# Patient Record
Sex: Male | Born: 1975 | Race: White | Hispanic: No | Marital: Married | Smoking: Never smoker
Health system: Southern US, Community
[De-identification: ages and names within clinical notes are randomized; demographics above are authoritative.]

## PROBLEM LIST (undated history)

## (undated) DIAGNOSIS — I10 Essential (primary) hypertension: Secondary | ICD-10-CM

## (undated) DIAGNOSIS — C801 Malignant (primary) neoplasm, unspecified: Secondary | ICD-10-CM

## (undated) HISTORY — PX: JOINT REPLACEMENT: SHX530

## (undated) HISTORY — PX: FEMUR SURGERY: SHX943

---

## 2016-11-16 DIAGNOSIS — E785 Hyperlipidemia, unspecified: Secondary | ICD-10-CM | POA: Diagnosis not present

## 2016-11-16 DIAGNOSIS — E349 Endocrine disorder, unspecified: Secondary | ICD-10-CM | POA: Diagnosis not present

## 2016-11-16 DIAGNOSIS — Z79899 Other long term (current) drug therapy: Secondary | ICD-10-CM | POA: Diagnosis not present

## 2016-11-16 DIAGNOSIS — E559 Vitamin D deficiency, unspecified: Secondary | ICD-10-CM | POA: Diagnosis not present

## 2016-11-16 DIAGNOSIS — R739 Hyperglycemia, unspecified: Secondary | ICD-10-CM | POA: Diagnosis not present

## 2016-12-01 ENCOUNTER — Emergency Department (HOSPITAL_BASED_OUTPATIENT_CLINIC_OR_DEPARTMENT_OTHER): Payer: Worker's Compensation

## 2016-12-01 ENCOUNTER — Emergency Department (HOSPITAL_BASED_OUTPATIENT_CLINIC_OR_DEPARTMENT_OTHER)
Admission: EM | Admit: 2016-12-01 | Discharge: 2016-12-01 | Disposition: A | Discharge status: Home / Self Care | Payer: Worker's Compensation | Attending: Emergency Medicine | Admitting: Emergency Medicine

## 2016-12-01 ENCOUNTER — Encounter (HOSPITAL_BASED_OUTPATIENT_CLINIC_OR_DEPARTMENT_OTHER): Payer: Self-pay | Admitting: Emergency Medicine

## 2016-12-01 DIAGNOSIS — S3992XA Unspecified injury of lower back, initial encounter: Secondary | ICD-10-CM | POA: Diagnosis present

## 2016-12-01 DIAGNOSIS — Z79899 Other long term (current) drug therapy: Secondary | ICD-10-CM | POA: Diagnosis not present

## 2016-12-01 DIAGNOSIS — Y929 Unspecified place or not applicable: Secondary | ICD-10-CM | POA: Insufficient documentation

## 2016-12-01 DIAGNOSIS — Z85830 Personal history of malignant neoplasm of bone: Secondary | ICD-10-CM | POA: Diagnosis not present

## 2016-12-01 DIAGNOSIS — S39012A Strain of muscle, fascia and tendon of lower back, initial encounter: Secondary | ICD-10-CM

## 2016-12-01 DIAGNOSIS — Y990 Civilian activity done for income or pay: Secondary | ICD-10-CM | POA: Insufficient documentation

## 2016-12-01 DIAGNOSIS — Y9389 Activity, other specified: Secondary | ICD-10-CM | POA: Insufficient documentation

## 2016-12-01 DIAGNOSIS — X500XXA Overexertion from strenuous movement or load, initial encounter: Secondary | ICD-10-CM | POA: Insufficient documentation

## 2016-12-01 DIAGNOSIS — I10 Essential (primary) hypertension: Secondary | ICD-10-CM | POA: Diagnosis not present

## 2016-12-01 HISTORY — DX: Essential (primary) hypertension: I10

## 2016-12-01 HISTORY — DX: Malignant (primary) neoplasm, unspecified: C80.1

## 2016-12-01 MED ORDER — CYCLOBENZAPRINE HCL 10 MG PO TABS: 10.0000 mg | ORAL_TABLET | Freq: Two times a day (BID) | ORAL | 0 refills | Status: AC | PRN

## 2016-12-01 NOTE — ED Provider Notes (Signed)
[place] [facility] Provider Note   CSN: [id number] Arrival date & time: 12/01/16  1632  By signing my name below, I, Kenneth Potts, attest that this documentation has been prepared under the direction and in the presence of [employer], PA-C. Electronically Signed: Gwenlyn Potts, ED Scribe. 12/01/16. 5:35 PM.  History   Chief Complaint Chief Complaint  Patient presents with  . Back Pain   The history is provided by the patient. No language interpreter was used.    HPI Comments: Kenneth Potts is a 41 y.o. male with PMHx of bone cancer and HTN who presents to the Emergency Department complaining of acute onset, constant, 9/10 lower back pain s/p injury at 2 PM. Back pain has gradually improved but is exacerbated when standing up and deep inhaltion. Pt bent over to pickup bread from the bottom shelf and felt a popping sensation in his lower back. He has tried Vicodin with mild relief to pain. No hx of back problems. No recent heavy lifting, injury, or trauma. Pt denies numbness, tingling, weakness, urinary or bowel incontinence.  Past Medical History:  Diagnosis Date  . Cancer ([place])   . Hypertension    There are no active problems to display for this patient.  Past Surgical History:  Procedure Laterality Date  . FEMUR SURGERY Right   . JOINT REPLACEMENT      Home Medications    Prior to Admission medications   Medication Sig Start Date End Date Taking? Authorizing Provider  gabapentin (NEURONTIN) 100 MG capsule Take 100 mg by mouth 3 (three) times daily.   Yes Historical Provider, MD  HYDROcodone-acetaminophen (NORCO/VICODIN) 5-325 MG tablet Take 1 tablet by mouth every 6 (six) hours as needed for moderate pain.   Yes Historical Provider, MD  losartan-hydrochlorothiazide (HYZAAR) 100-12.5 MG tablet Take 1 tablet by mouth daily.   Yes Historical Provider, MD  meloxicam (MOBIC) 15 MG tablet Take 15 mg by mouth daily.   Yes Historical Provider, MD  cyclobenzaprine (FLEXERIL) 10  MG tablet Take 1 tablet (10 mg total) by mouth 2 (two) times daily as needed for muscle spasms. 12/01/16   [name], PA-C    Family History No family history on file.  Social History Social History  Substance Use Topics  . Smoking status: Never Smoker  . Smokeless tobacco: Never Used  . Alcohol use No    Allergies   Penicillins  Review of Systems Review of Systems  Genitourinary:       No urinary or bowel incontinence   Musculoskeletal: Positive for back pain.  Neurological: Negative for weakness and numbness.  All other systems reviewed and are negative.  Physical Exam Updated Vital Signs BP 125/100   Pulse 85   Temp 98.3 F (36.8 C) (Oral)   Resp 18   Ht 5\' 11"  (1.803 m)   Wt 122.5 kg   SpO2 99%   BMI 37.66 kg/m   Physical Exam  Constitutional: He is oriented to person, place, and time. He appears well-developed and well-nourished. He is active. No distress.  HENT:  Head: Normocephalic and atraumatic.  Eyes: Conjunctivae are normal.  Cardiovascular: Normal rate.   Pulmonary/Chest: Effort normal. No respiratory distress.  Musculoskeletal: Normal range of motion.  No CT or L-spine tenderness-back atraumatic with no signs of trauma-straight leg negative distal sensation strength and motor function intact  Neurological: He is alert and oriented to person, place, and time.  Skin: Skin is warm and dry.  Psychiatric: He has a normal mood and  affect. His behavior is normal.  Nursing note and vitals reviewed.  ED Treatments / Results  DIAGNOSTIC STUDIES: Oxygen Saturation is 98% on RA, normal by my interpretation.    COORDINATION OF CARE: 5:20 PM Discussed treatment plan with pt at bedside which includes DG Lumbar Complete, muscle relaxer, and referral to sports medicine and pt agreed to plan.  Labs (all labs ordered are listed, but only abnormal results are displayed) Labs Reviewed - No data to display  EKG  EKG Interpretation None        Radiology Dg Lumbar Spine Complete  Result Date: 12/01/2016 CLINICAL DATA:  Low back injury at work today.  Lifting injury EXAM: LUMBAR SPINE - COMPLETE 4+ VIEW COMPARISON:  None. FINDINGS: No fracture. No subluxation. Intervertebral disc spaces are preserved and lumbar spine. Patient is noted to have loss of disc height with endplate degeneration at T11-12. SI joints are normal. IMPRESSION: Degenerative changes T11-12.  Otherwise unremarkable. Electronically Signed   By: Misty Stanley M.D.   On: 12/01/2016 17:20    Procedures Procedures (including critical care time)  Medications Ordered in ED Medications - No data to display   Initial Impression / Assessment and Plan / ED Course  I have reviewed the triage vital signs and the nursing notes.  Pertinent labs & imaging results that were available during my care of the patient were reviewed by me and considered in my medical decision making (see chart for details).     I personally performed the services described in this documentation, which was scribed in my presence. The recorded information has been reviewed and is accurate.   Final Clinical Impressions(s) / ED Diagnoses   Final diagnoses:  Strain of lumbar region, initial encounter    41 year old male presents today with back pain.  This is uncomplicated with no red flags.  He will be given care instructions, follow-up information and strict return precautions.  He verbalized understanding and agreement to today's plan had no further questions or concerns  New Prescriptions Discharge Medication List as of 12/01/2016  5:55 PM    START taking these medications   Details  cyclobenzaprine (FLEXERIL) 10 MG tablet Take 1 tablet (10 mg total) by mouth 2 (two) times daily as needed for muscle spasms., Starting Sat 12/01/2016, Print         [employer], PA-C 12/02/16 [place], MD 12/02/16 1435

## 2016-12-01 NOTE — Discharge Instructions (Signed)
Please read attached information. If you experience any new or worsening signs or symptoms please return to the emergency room for evaluation. Please follow-up with your primary care provider or specialist as discussed. Please use medication prescribed only as directed and discontinue taking if you have any concerning signs or symptoms.   °

## 2016-12-01 NOTE — ED Notes (Signed)
Given ice pack in triage

## 2016-12-01 NOTE — ED Triage Notes (Signed)
Bent over at work to pick up bread off bottom shelf and felt back pop and numbness/tingling to right leg. Accident about 1400 today. Took Vicodin PTA

## 2016-12-14 ENCOUNTER — Other Ambulatory Visit: Payer: Self-pay | Admitting: Sports Medicine

## 2016-12-14 DIAGNOSIS — S335XXA Sprain of ligaments of lumbar spine, initial encounter: Secondary | ICD-10-CM | POA: Diagnosis not present

## 2016-12-14 DIAGNOSIS — M545 Low back pain: Secondary | ICD-10-CM

## 2016-12-19 ENCOUNTER — Ambulatory Visit
Admission: RE | Admit: 2016-12-19 | Discharge: 2016-12-19 | Disposition: A | Discharge status: Home / Self Care | Payer: BLUE CROSS/BLUE SHIELD | Source: Ambulatory Visit | Attending: Sports Medicine | Admitting: Sports Medicine

## 2016-12-19 DIAGNOSIS — M545 Low back pain: Secondary | ICD-10-CM

## 2016-12-19 DIAGNOSIS — M48061 Spinal stenosis, lumbar region without neurogenic claudication: Secondary | ICD-10-CM | POA: Diagnosis not present

## 2016-12-20 DIAGNOSIS — M545 Low back pain: Secondary | ICD-10-CM | POA: Diagnosis not present

## 2016-12-28 ENCOUNTER — Other Ambulatory Visit: Payer: Self-pay

## 2017-02-20 DIAGNOSIS — E559 Vitamin D deficiency, unspecified: Secondary | ICD-10-CM | POA: Diagnosis not present

## 2017-02-20 DIAGNOSIS — I10 Essential (primary) hypertension: Secondary | ICD-10-CM | POA: Diagnosis not present

## 2017-02-20 DIAGNOSIS — Z79899 Other long term (current) drug therapy: Secondary | ICD-10-CM | POA: Diagnosis not present

## 2017-02-20 DIAGNOSIS — E785 Hyperlipidemia, unspecified: Secondary | ICD-10-CM | POA: Diagnosis not present

## 2017-02-20 DIAGNOSIS — R739 Hyperglycemia, unspecified: Secondary | ICD-10-CM | POA: Diagnosis not present

## 2017-02-21 DIAGNOSIS — T8484XD Pain due to internal orthopedic prosthetic devices, implants and grafts, subsequent encounter: Secondary | ICD-10-CM | POA: Diagnosis not present

## 2017-02-21 DIAGNOSIS — Z96651 Presence of right artificial knee joint: Secondary | ICD-10-CM | POA: Diagnosis not present

## 2017-02-21 DIAGNOSIS — R936 Abnormal findings on diagnostic imaging of limbs: Secondary | ICD-10-CM | POA: Diagnosis not present

## 2017-03-01 DIAGNOSIS — Z96651 Presence of right artificial knee joint: Secondary | ICD-10-CM | POA: Diagnosis not present

## 2017-03-01 DIAGNOSIS — T8484XD Pain due to internal orthopedic prosthetic devices, implants and grafts, subsequent encounter: Secondary | ICD-10-CM | POA: Diagnosis not present

## 2017-03-20 DIAGNOSIS — Z96651 Presence of right artificial knee joint: Secondary | ICD-10-CM | POA: Diagnosis not present

## 2017-03-20 DIAGNOSIS — T8484XD Pain due to internal orthopedic prosthetic devices, implants and grafts, subsequent encounter: Secondary | ICD-10-CM | POA: Diagnosis not present

## 2017-03-20 DIAGNOSIS — F418 Other specified anxiety disorders: Secondary | ICD-10-CM | POA: Diagnosis not present

## 2017-03-26 DIAGNOSIS — M25561 Pain in right knee: Secondary | ICD-10-CM | POA: Diagnosis not present

## 2017-03-26 DIAGNOSIS — M25461 Effusion, right knee: Secondary | ICD-10-CM | POA: Diagnosis not present

## 2017-03-26 DIAGNOSIS — T8484XD Pain due to internal orthopedic prosthetic devices, implants and grafts, subsequent encounter: Secondary | ICD-10-CM | POA: Diagnosis not present

## 2017-03-26 DIAGNOSIS — Z96651 Presence of right artificial knee joint: Secondary | ICD-10-CM | POA: Diagnosis not present

## 2017-05-01 DIAGNOSIS — Z96651 Presence of right artificial knee joint: Secondary | ICD-10-CM | POA: Diagnosis not present

## 2017-05-01 DIAGNOSIS — T8484XD Pain due to internal orthopedic prosthetic devices, implants and grafts, subsequent encounter: Secondary | ICD-10-CM | POA: Diagnosis not present

## 2017-05-03 DIAGNOSIS — T8484XA Pain due to internal orthopedic prosthetic devices, implants and grafts, initial encounter: Secondary | ICD-10-CM | POA: Diagnosis not present

## 2017-05-03 DIAGNOSIS — Z01812 Encounter for preprocedural laboratory examination: Secondary | ICD-10-CM | POA: Diagnosis not present

## 2017-05-03 DIAGNOSIS — Z96651 Presence of right artificial knee joint: Secondary | ICD-10-CM | POA: Diagnosis not present

## 2017-05-03 DIAGNOSIS — Z01810 Encounter for preprocedural cardiovascular examination: Secondary | ICD-10-CM | POA: Diagnosis not present

## 2017-05-03 DIAGNOSIS — Z01818 Encounter for other preprocedural examination: Secondary | ICD-10-CM | POA: Diagnosis not present

## 2017-05-03 DIAGNOSIS — Z85830 Personal history of malignant neoplasm of bone: Secondary | ICD-10-CM | POA: Diagnosis not present

## 2017-05-03 DIAGNOSIS — M1711 Unilateral primary osteoarthritis, right knee: Secondary | ICD-10-CM | POA: Diagnosis not present

## 2017-05-03 DIAGNOSIS — I10 Essential (primary) hypertension: Secondary | ICD-10-CM | POA: Diagnosis not present

## 2017-05-03 DIAGNOSIS — Z79899 Other long term (current) drug therapy: Secondary | ICD-10-CM | POA: Diagnosis not present

## 2017-05-08 DIAGNOSIS — Z96651 Presence of right artificial knee joint: Secondary | ICD-10-CM | POA: Diagnosis not present

## 2017-05-08 DIAGNOSIS — T8484XA Pain due to internal orthopedic prosthetic devices, implants and grafts, initial encounter: Secondary | ICD-10-CM | POA: Diagnosis not present

## 2017-05-08 DIAGNOSIS — R2689 Other abnormalities of gait and mobility: Secondary | ICD-10-CM | POA: Diagnosis not present

## 2017-05-08 DIAGNOSIS — Z471 Aftercare following joint replacement surgery: Secondary | ICD-10-CM | POA: Diagnosis not present

## 2017-05-08 DIAGNOSIS — M19012 Primary osteoarthritis, left shoulder: Secondary | ICD-10-CM | POA: Diagnosis not present

## 2017-05-08 DIAGNOSIS — M1712 Unilateral primary osteoarthritis, left knee: Secondary | ICD-10-CM | POA: Diagnosis not present

## 2017-05-08 DIAGNOSIS — Z79891 Long term (current) use of opiate analgesic: Secondary | ICD-10-CM | POA: Diagnosis not present

## 2017-05-08 DIAGNOSIS — I10 Essential (primary) hypertension: Secondary | ICD-10-CM | POA: Diagnosis not present

## 2017-05-08 DIAGNOSIS — D62 Acute posthemorrhagic anemia: Secondary | ICD-10-CM | POA: Diagnosis not present

## 2017-05-08 DIAGNOSIS — Z6836 Body mass index (BMI) 36.0-36.9, adult: Secondary | ICD-10-CM | POA: Diagnosis not present

## 2017-05-08 DIAGNOSIS — T84032A Mechanical loosening of internal right knee prosthetic joint, initial encounter: Secondary | ICD-10-CM | POA: Diagnosis not present

## 2017-05-08 DIAGNOSIS — M25511 Pain in right shoulder: Secondary | ICD-10-CM | POA: Diagnosis not present

## 2017-05-08 DIAGNOSIS — Z85830 Personal history of malignant neoplasm of bone: Secondary | ICD-10-CM | POA: Diagnosis not present

## 2017-05-08 DIAGNOSIS — M25512 Pain in left shoulder: Secondary | ICD-10-CM | POA: Diagnosis not present

## 2017-05-08 DIAGNOSIS — Z880 Allergy status to penicillin: Secondary | ICD-10-CM | POA: Diagnosis not present

## 2017-05-08 DIAGNOSIS — T84012A Broken internal right knee prosthesis, initial encounter: Secondary | ICD-10-CM | POA: Diagnosis not present

## 2017-05-08 DIAGNOSIS — M19011 Primary osteoarthritis, right shoulder: Secondary | ICD-10-CM | POA: Diagnosis not present

## 2017-05-08 DIAGNOSIS — Z7409 Other reduced mobility: Secondary | ICD-10-CM | POA: Diagnosis not present

## 2017-05-08 DIAGNOSIS — Z791 Long term (current) use of non-steroidal anti-inflammatories (NSAID): Secondary | ICD-10-CM | POA: Diagnosis not present

## 2017-05-08 DIAGNOSIS — M65161 Other infective (teno)synovitis, right knee: Secondary | ICD-10-CM | POA: Diagnosis not present

## 2017-05-08 DIAGNOSIS — Z79899 Other long term (current) drug therapy: Secondary | ICD-10-CM | POA: Diagnosis not present

## 2017-05-08 DIAGNOSIS — T84062A Wear of articular bearing surface of internal prosthetic right knee joint, initial encounter: Secondary | ICD-10-CM | POA: Diagnosis not present

## 2017-05-08 DIAGNOSIS — E669 Obesity, unspecified: Secondary | ICD-10-CM | POA: Diagnosis not present

## 2017-05-08 DIAGNOSIS — T8484XD Pain due to internal orthopedic prosthetic devices, implants and grafts, subsequent encounter: Secondary | ICD-10-CM | POA: Diagnosis not present

## 2017-05-10 DIAGNOSIS — M25511 Pain in right shoulder: Secondary | ICD-10-CM | POA: Diagnosis not present

## 2017-05-10 DIAGNOSIS — M19011 Primary osteoarthritis, right shoulder: Secondary | ICD-10-CM | POA: Diagnosis not present

## 2017-05-10 DIAGNOSIS — M25512 Pain in left shoulder: Secondary | ICD-10-CM | POA: Diagnosis not present

## 2017-05-10 DIAGNOSIS — M19012 Primary osteoarthritis, left shoulder: Secondary | ICD-10-CM | POA: Diagnosis not present

## 2017-05-12 DIAGNOSIS — T8484XD Pain due to internal orthopedic prosthetic devices, implants and grafts, subsequent encounter: Secondary | ICD-10-CM | POA: Diagnosis not present

## 2017-05-12 DIAGNOSIS — Z7409 Other reduced mobility: Secondary | ICD-10-CM | POA: Diagnosis not present

## 2017-05-12 DIAGNOSIS — R2689 Other abnormalities of gait and mobility: Secondary | ICD-10-CM | POA: Diagnosis not present

## 2017-05-14 DIAGNOSIS — I10 Essential (primary) hypertension: Secondary | ICD-10-CM | POA: Diagnosis not present

## 2017-05-14 DIAGNOSIS — T8484XD Pain due to internal orthopedic prosthetic devices, implants and grafts, subsequent encounter: Secondary | ICD-10-CM | POA: Diagnosis not present

## 2017-05-14 DIAGNOSIS — Z85830 Personal history of malignant neoplasm of bone: Secondary | ICD-10-CM | POA: Diagnosis not present

## 2017-05-14 DIAGNOSIS — Z791 Long term (current) use of non-steroidal anti-inflammatories (NSAID): Secondary | ICD-10-CM | POA: Diagnosis not present

## 2017-05-14 DIAGNOSIS — T84012D Broken internal right knee prosthesis, subsequent encounter: Secondary | ICD-10-CM | POA: Diagnosis not present

## 2017-05-17 DIAGNOSIS — Z791 Long term (current) use of non-steroidal anti-inflammatories (NSAID): Secondary | ICD-10-CM | POA: Diagnosis not present

## 2017-05-17 DIAGNOSIS — T84012D Broken internal right knee prosthesis, subsequent encounter: Secondary | ICD-10-CM | POA: Diagnosis not present

## 2017-05-17 DIAGNOSIS — Z85830 Personal history of malignant neoplasm of bone: Secondary | ICD-10-CM | POA: Diagnosis not present

## 2017-05-17 DIAGNOSIS — I10 Essential (primary) hypertension: Secondary | ICD-10-CM | POA: Diagnosis not present

## 2017-05-17 DIAGNOSIS — T8484XD Pain due to internal orthopedic prosthetic devices, implants and grafts, subsequent encounter: Secondary | ICD-10-CM | POA: Diagnosis not present

## 2017-05-20 DIAGNOSIS — Z85830 Personal history of malignant neoplasm of bone: Secondary | ICD-10-CM | POA: Diagnosis not present

## 2017-05-20 DIAGNOSIS — T84012D Broken internal right knee prosthesis, subsequent encounter: Secondary | ICD-10-CM | POA: Diagnosis not present

## 2017-05-20 DIAGNOSIS — T8484XD Pain due to internal orthopedic prosthetic devices, implants and grafts, subsequent encounter: Secondary | ICD-10-CM | POA: Diagnosis not present

## 2017-05-20 DIAGNOSIS — Z791 Long term (current) use of non-steroidal anti-inflammatories (NSAID): Secondary | ICD-10-CM | POA: Diagnosis not present

## 2017-05-20 DIAGNOSIS — I10 Essential (primary) hypertension: Secondary | ICD-10-CM | POA: Diagnosis not present

## 2017-05-21 DIAGNOSIS — T8484XD Pain due to internal orthopedic prosthetic devices, implants and grafts, subsequent encounter: Secondary | ICD-10-CM | POA: Diagnosis not present

## 2017-05-21 DIAGNOSIS — Z85830 Personal history of malignant neoplasm of bone: Secondary | ICD-10-CM | POA: Diagnosis not present

## 2017-05-21 DIAGNOSIS — T84012D Broken internal right knee prosthesis, subsequent encounter: Secondary | ICD-10-CM | POA: Diagnosis not present

## 2017-05-21 DIAGNOSIS — Z791 Long term (current) use of non-steroidal anti-inflammatories (NSAID): Secondary | ICD-10-CM | POA: Diagnosis not present

## 2017-05-21 DIAGNOSIS — I10 Essential (primary) hypertension: Secondary | ICD-10-CM | POA: Diagnosis not present

## 2017-05-21 DIAGNOSIS — Z96651 Presence of right artificial knee joint: Secondary | ICD-10-CM | POA: Diagnosis not present

## 2017-05-21 DIAGNOSIS — Z0389 Encounter for observation for other suspected diseases and conditions ruled out: Secondary | ICD-10-CM | POA: Diagnosis not present

## 2017-05-23 DIAGNOSIS — Z85830 Personal history of malignant neoplasm of bone: Secondary | ICD-10-CM | POA: Diagnosis not present

## 2017-05-23 DIAGNOSIS — I10 Essential (primary) hypertension: Secondary | ICD-10-CM | POA: Diagnosis not present

## 2017-05-23 DIAGNOSIS — Z791 Long term (current) use of non-steroidal anti-inflammatories (NSAID): Secondary | ICD-10-CM | POA: Diagnosis not present

## 2017-05-23 DIAGNOSIS — T8484XD Pain due to internal orthopedic prosthetic devices, implants and grafts, subsequent encounter: Secondary | ICD-10-CM | POA: Diagnosis not present

## 2017-05-23 DIAGNOSIS — T84012D Broken internal right knee prosthesis, subsequent encounter: Secondary | ICD-10-CM | POA: Diagnosis not present

## 2017-05-24 DIAGNOSIS — Z791 Long term (current) use of non-steroidal anti-inflammatories (NSAID): Secondary | ICD-10-CM | POA: Diagnosis not present

## 2017-05-24 DIAGNOSIS — I10 Essential (primary) hypertension: Secondary | ICD-10-CM | POA: Diagnosis not present

## 2017-05-24 DIAGNOSIS — T84012D Broken internal right knee prosthesis, subsequent encounter: Secondary | ICD-10-CM | POA: Diagnosis not present

## 2017-05-24 DIAGNOSIS — T8484XD Pain due to internal orthopedic prosthetic devices, implants and grafts, subsequent encounter: Secondary | ICD-10-CM | POA: Diagnosis not present

## 2017-05-24 DIAGNOSIS — Z85830 Personal history of malignant neoplasm of bone: Secondary | ICD-10-CM | POA: Diagnosis not present

## 2017-05-27 DIAGNOSIS — I10 Essential (primary) hypertension: Secondary | ICD-10-CM | POA: Diagnosis not present

## 2017-05-27 DIAGNOSIS — Z791 Long term (current) use of non-steroidal anti-inflammatories (NSAID): Secondary | ICD-10-CM | POA: Diagnosis not present

## 2017-05-27 DIAGNOSIS — T8484XD Pain due to internal orthopedic prosthetic devices, implants and grafts, subsequent encounter: Secondary | ICD-10-CM | POA: Diagnosis not present

## 2017-05-27 DIAGNOSIS — T84012D Broken internal right knee prosthesis, subsequent encounter: Secondary | ICD-10-CM | POA: Diagnosis not present

## 2017-05-27 DIAGNOSIS — Z85830 Personal history of malignant neoplasm of bone: Secondary | ICD-10-CM | POA: Diagnosis not present

## 2017-05-29 DIAGNOSIS — E785 Hyperlipidemia, unspecified: Secondary | ICD-10-CM | POA: Diagnosis not present

## 2017-05-29 DIAGNOSIS — E559 Vitamin D deficiency, unspecified: Secondary | ICD-10-CM | POA: Diagnosis not present

## 2017-05-29 DIAGNOSIS — E349 Endocrine disorder, unspecified: Secondary | ICD-10-CM | POA: Diagnosis not present

## 2017-05-29 DIAGNOSIS — R739 Hyperglycemia, unspecified: Secondary | ICD-10-CM | POA: Diagnosis not present

## 2017-05-29 DIAGNOSIS — M25569 Pain in unspecified knee: Secondary | ICD-10-CM | POA: Diagnosis not present

## 2017-05-30 DIAGNOSIS — Z471 Aftercare following joint replacement surgery: Secondary | ICD-10-CM | POA: Diagnosis not present

## 2017-05-30 DIAGNOSIS — Z96651 Presence of right artificial knee joint: Secondary | ICD-10-CM | POA: Diagnosis not present

## 2017-05-30 DIAGNOSIS — R262 Difficulty in walking, not elsewhere classified: Secondary | ICD-10-CM | POA: Diagnosis not present

## 2017-05-30 DIAGNOSIS — M6281 Muscle weakness (generalized): Secondary | ICD-10-CM | POA: Diagnosis not present

## 2017-05-30 IMAGING — MR MR LUMBAR SPINE W/O CM
4 of 5 series · 25 of 48 positions shown · non-contrast
Comparison: Plain films lumbar spine 12/01/2016.

CLINICAL DATA: The patient felt a pop in the low back with onset of
back and bilateral lower extremity pain 3 weeks ago when bending
over to pick something up.

EXAM:
MRI LUMBAR SPINE WITHOUT CONTRAST
TECHNIQUE: Multiplanar, multisequence MR imaging of the lumbar spine was
performed. No intravenous contrast was administered.

[Series 3: T2 · sagittal · 4.0mm · 0.55mm/px · 6 of 14 slices shown (1 of 2)]
[im 1/14]
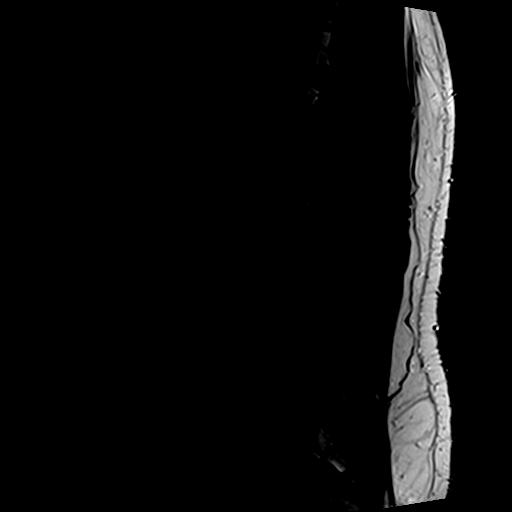
[im 3/14]
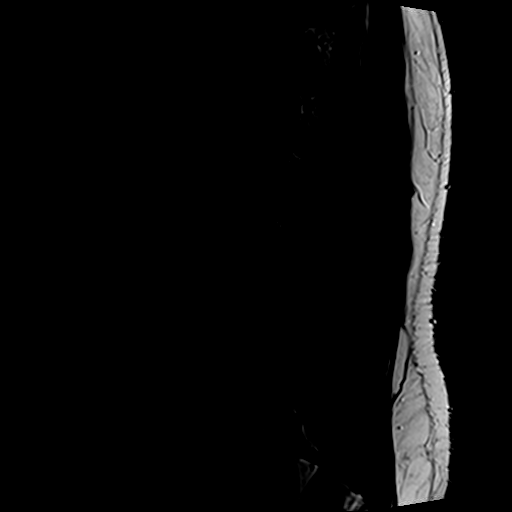
[im 6/14]
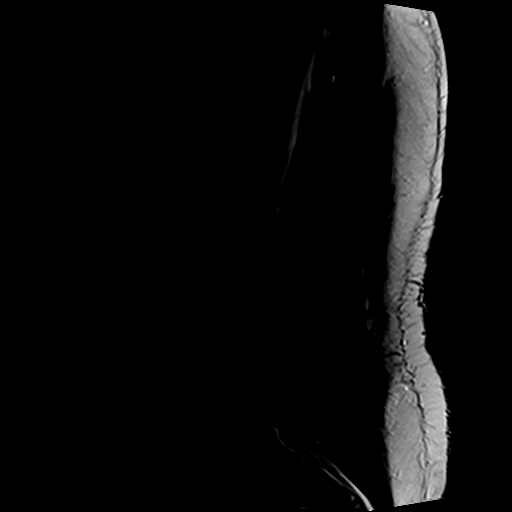
[im 8/14]
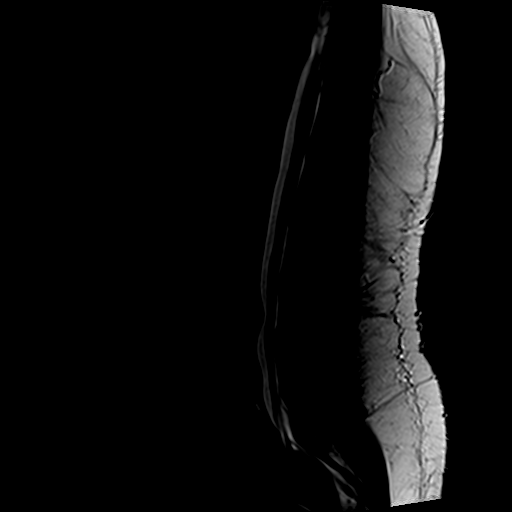
[im 11/14]
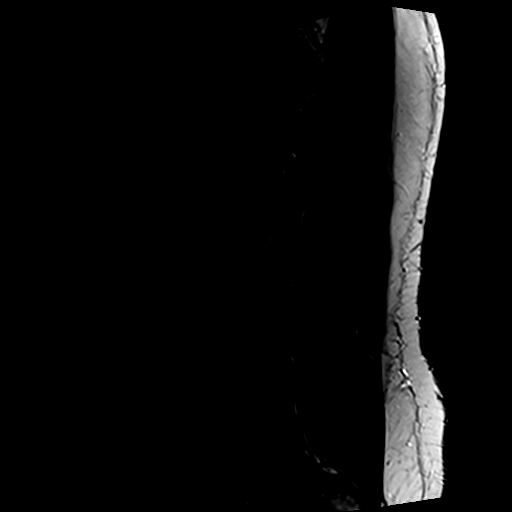
[im 14/14]
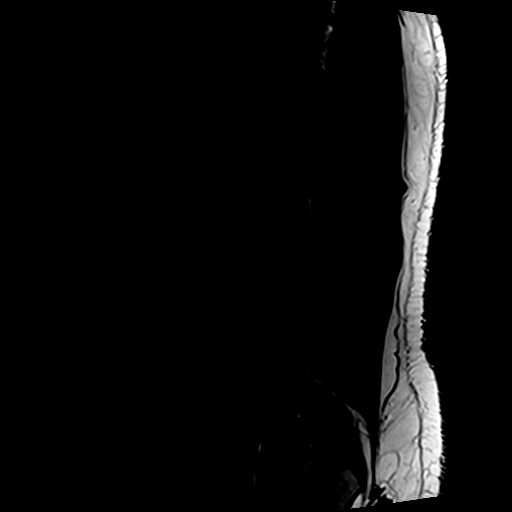

[Series 4: T1 · sagittal · 4.0mm · 0.55mm/px · 6 of 14 slices shown (1 of 2)]
[im 1/14]
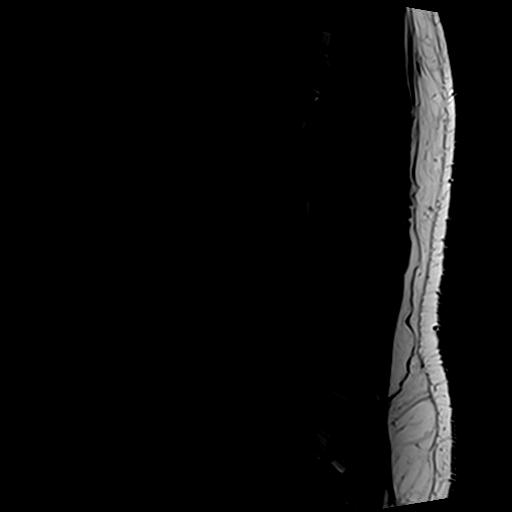
[im 3/14]
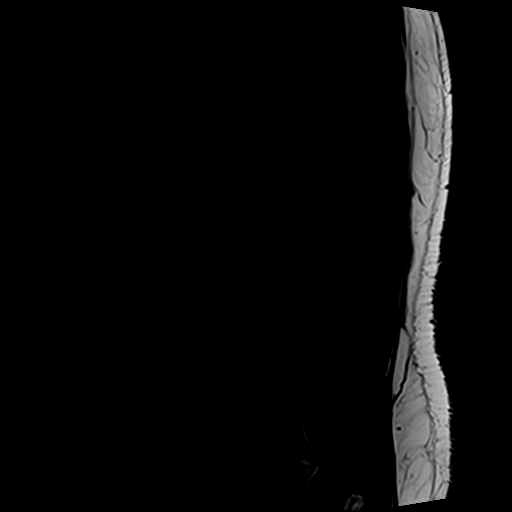
[im 6/14]
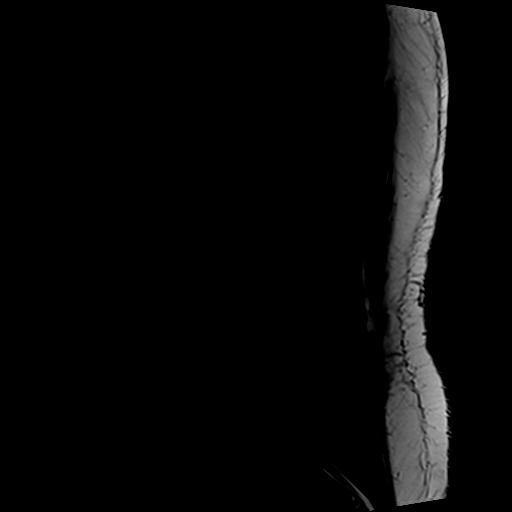
[im 8/14]
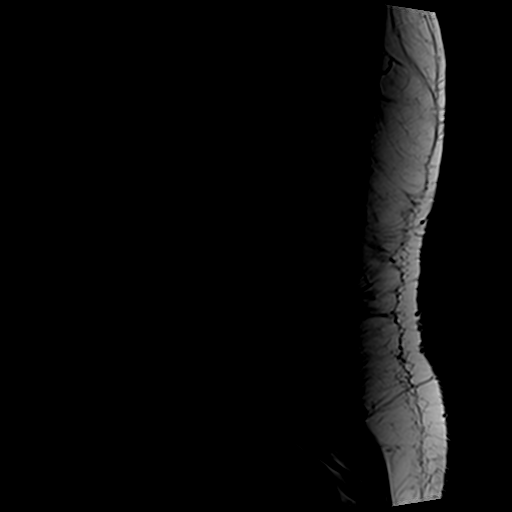
[im 11/14]
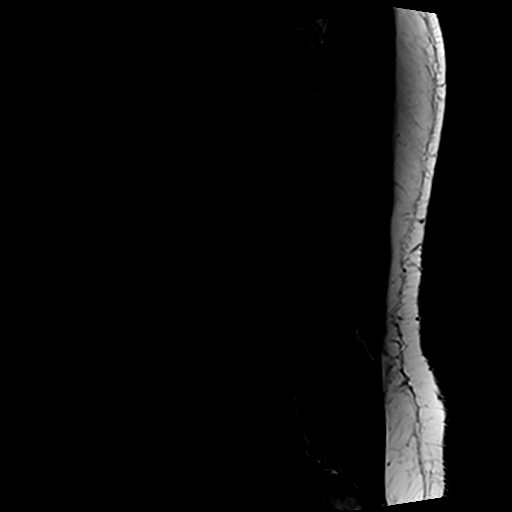
[im 14/14]
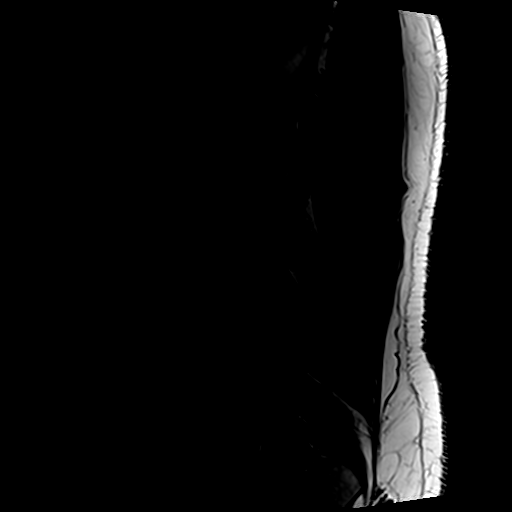

[Series 6: T2 · axial · 4.0mm · 0.70mm/px · z∈[-131,+68]mm · 9 of 37 slices shown (2 of 2)]
[im 1/37]
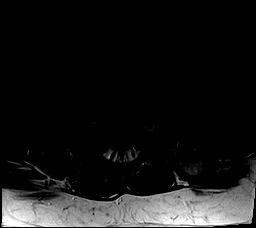
[im 6/37]
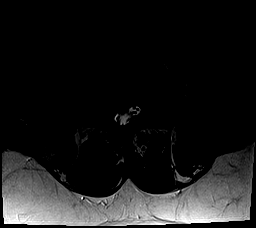
[im 11/37]
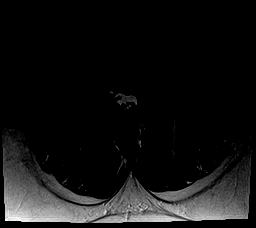
[im 16/37]
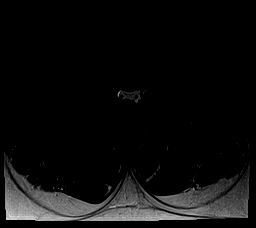
[im 19/37]
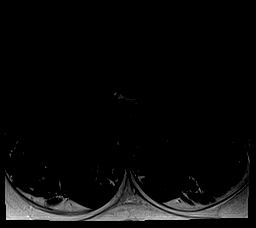
[im 21/37]
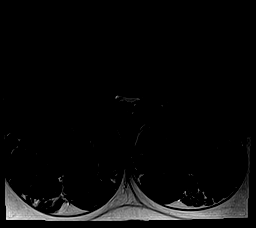
[im 26/37]
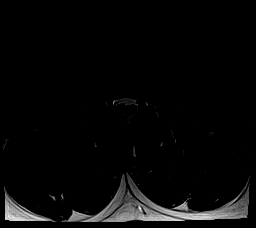
[im 31/37]
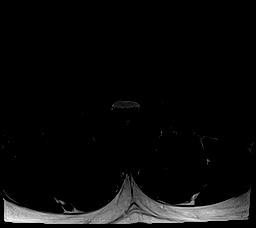
[im 37/37]
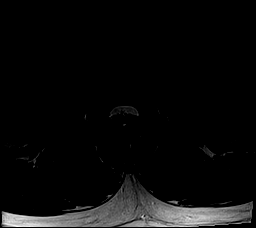

[Series 7: T1 · axial · 4.0mm · 0.35mm/px · z∈[-131,+37]mm · 4 of 37 slices shown (2 of 2)]
[im 1/37]
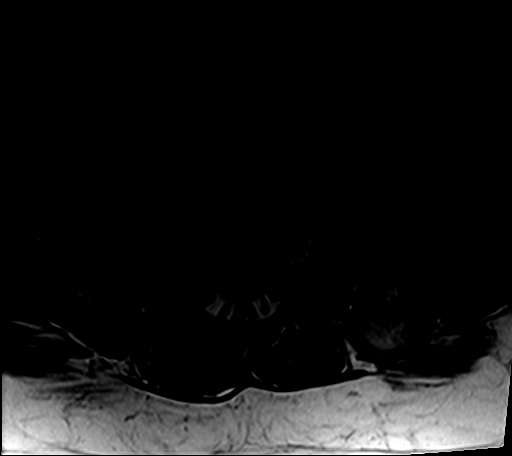
[im 6/37]
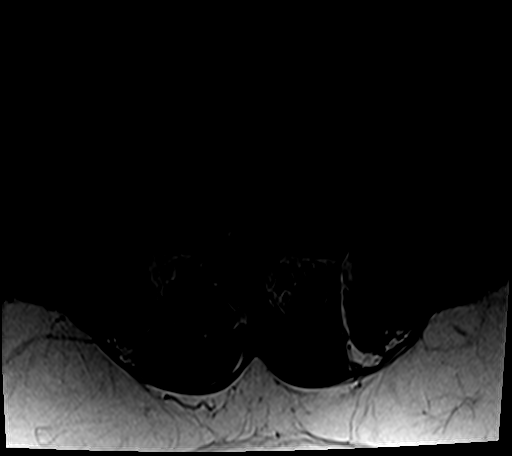
[im 19/37]
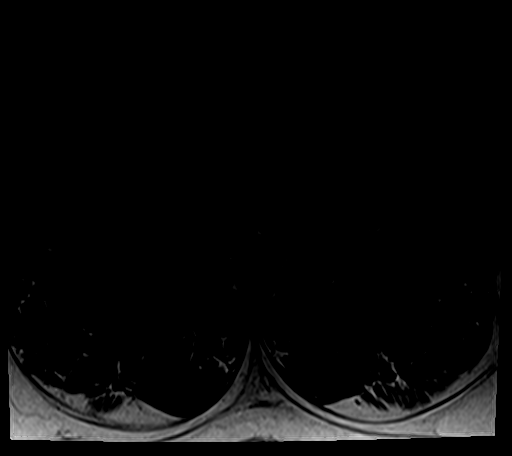
[im 31/37]
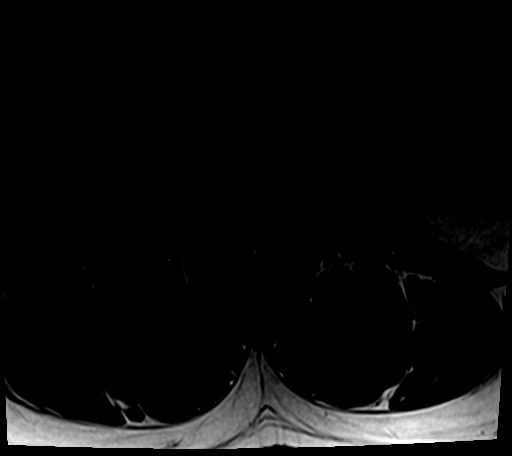

[25 of 48 positions shown; findings below may reference images not displayed]

FINDINGS: Segmentation:  Standard.

Alignment:  Maintained.

Vertebrae:  No fracture or worrisome lesion.

Conus medullaris: Extends to the L1 level and appears normal.

Paraspinal and other soft tissues: Negative.

Disc levels:

T11-12 and T12-L1 are imaged in the sagittal plane only. There is
some loss of disc space height and a small bulge at T11-12 without
central canal or foraminal stenosis. T12-L1 is negative.

L1-2:  Negative.

L2-3:  Negative.

L3-4: There is a disc bulge with a superimposed central and left
paracentral protrusion with slight caudal extension. The disc causes
mild to moderate central canal narrowing. The protrusion contacts
the descending left L4 root but the root does not appear compressed.
The foramina are open.

L4-5: Broad-based central protrusion causes some narrowing in the
subarticular recesses which could impact the descending L5 roots.
Subarticular recess narrowing appears worse on the left. Mild to
moderate foraminal narrowing is more notable on the left.

L5-S1: Annular fissure and shallow central protrusion without
central canal or foraminal stenosis.
IMPRESSION: Shallow disc bulge with a superimposed central and left paracentral
protrusion causes mild to my central canal narrowing. The disc
contacts the descending left L4 root but the root does not appear
compressed.

Left greater right subarticular recess narrowing at L4-5 due to a
broad-based disc bulge could impact either descending L5 root. Mild
to moderate foraminal narrowing at this level is worse on the left.

Shallow central protrusion L5-S1 without central canal or foraminal
stenosis.

## 2017-06-04 DIAGNOSIS — Z471 Aftercare following joint replacement surgery: Secondary | ICD-10-CM | POA: Diagnosis not present

## 2017-06-04 DIAGNOSIS — M6281 Muscle weakness (generalized): Secondary | ICD-10-CM | POA: Diagnosis not present

## 2017-06-04 DIAGNOSIS — Z96651 Presence of right artificial knee joint: Secondary | ICD-10-CM | POA: Diagnosis not present

## 2017-06-04 DIAGNOSIS — R262 Difficulty in walking, not elsewhere classified: Secondary | ICD-10-CM | POA: Diagnosis not present

## 2017-06-11 DIAGNOSIS — Z471 Aftercare following joint replacement surgery: Secondary | ICD-10-CM | POA: Diagnosis not present

## 2017-06-11 DIAGNOSIS — Z96651 Presence of right artificial knee joint: Secondary | ICD-10-CM | POA: Diagnosis not present

## 2017-06-11 DIAGNOSIS — M6281 Muscle weakness (generalized): Secondary | ICD-10-CM | POA: Diagnosis not present

## 2017-06-11 DIAGNOSIS — R262 Difficulty in walking, not elsewhere classified: Secondary | ICD-10-CM | POA: Diagnosis not present

## 2017-06-12 DIAGNOSIS — T8484XD Pain due to internal orthopedic prosthetic devices, implants and grafts, subsequent encounter: Secondary | ICD-10-CM | POA: Diagnosis not present

## 2017-06-18 DIAGNOSIS — M6281 Muscle weakness (generalized): Secondary | ICD-10-CM | POA: Diagnosis not present

## 2017-06-18 DIAGNOSIS — Z471 Aftercare following joint replacement surgery: Secondary | ICD-10-CM | POA: Diagnosis not present

## 2017-06-18 DIAGNOSIS — R262 Difficulty in walking, not elsewhere classified: Secondary | ICD-10-CM | POA: Diagnosis not present

## 2017-06-18 DIAGNOSIS — Z96651 Presence of right artificial knee joint: Secondary | ICD-10-CM | POA: Diagnosis not present

## 2017-06-25 DIAGNOSIS — Z96651 Presence of right artificial knee joint: Secondary | ICD-10-CM | POA: Diagnosis not present

## 2017-06-25 DIAGNOSIS — Z471 Aftercare following joint replacement surgery: Secondary | ICD-10-CM | POA: Diagnosis not present

## 2017-06-25 DIAGNOSIS — M25662 Stiffness of left knee, not elsewhere classified: Secondary | ICD-10-CM | POA: Diagnosis not present

## 2017-06-25 DIAGNOSIS — M6281 Muscle weakness (generalized): Secondary | ICD-10-CM | POA: Diagnosis not present

## 2017-07-01 DIAGNOSIS — Z09 Encounter for follow-up examination after completed treatment for conditions other than malignant neoplasm: Secondary | ICD-10-CM | POA: Diagnosis not present

## 2017-07-01 DIAGNOSIS — Z96651 Presence of right artificial knee joint: Secondary | ICD-10-CM | POA: Diagnosis not present

## 2017-07-02 DIAGNOSIS — Z471 Aftercare following joint replacement surgery: Secondary | ICD-10-CM | POA: Diagnosis not present

## 2017-07-02 DIAGNOSIS — Z96651 Presence of right artificial knee joint: Secondary | ICD-10-CM | POA: Diagnosis not present

## 2017-07-02 DIAGNOSIS — M6281 Muscle weakness (generalized): Secondary | ICD-10-CM | POA: Diagnosis not present

## 2017-07-02 DIAGNOSIS — M25662 Stiffness of left knee, not elsewhere classified: Secondary | ICD-10-CM | POA: Diagnosis not present

## 2017-07-10 DIAGNOSIS — Z96651 Presence of right artificial knee joint: Secondary | ICD-10-CM | POA: Diagnosis not present

## 2017-07-10 DIAGNOSIS — M6281 Muscle weakness (generalized): Secondary | ICD-10-CM | POA: Diagnosis not present

## 2017-07-10 DIAGNOSIS — Z471 Aftercare following joint replacement surgery: Secondary | ICD-10-CM | POA: Diagnosis not present

## 2017-07-10 DIAGNOSIS — M25662 Stiffness of left knee, not elsewhere classified: Secondary | ICD-10-CM | POA: Diagnosis not present

## 2017-07-12 DIAGNOSIS — T8484XD Pain due to internal orthopedic prosthetic devices, implants and grafts, subsequent encounter: Secondary | ICD-10-CM | POA: Diagnosis not present

## 2017-07-16 DIAGNOSIS — Z96651 Presence of right artificial knee joint: Secondary | ICD-10-CM | POA: Diagnosis not present

## 2017-07-16 DIAGNOSIS — M6281 Muscle weakness (generalized): Secondary | ICD-10-CM | POA: Diagnosis not present

## 2017-07-16 DIAGNOSIS — M25662 Stiffness of left knee, not elsewhere classified: Secondary | ICD-10-CM | POA: Diagnosis not present

## 2017-07-16 DIAGNOSIS — Z471 Aftercare following joint replacement surgery: Secondary | ICD-10-CM | POA: Diagnosis not present

## 2017-07-23 DIAGNOSIS — Z471 Aftercare following joint replacement surgery: Secondary | ICD-10-CM | POA: Diagnosis not present

## 2017-07-23 DIAGNOSIS — M6281 Muscle weakness (generalized): Secondary | ICD-10-CM | POA: Diagnosis not present

## 2017-07-23 DIAGNOSIS — Z96651 Presence of right artificial knee joint: Secondary | ICD-10-CM | POA: Diagnosis not present

## 2017-07-23 DIAGNOSIS — M25662 Stiffness of left knee, not elsewhere classified: Secondary | ICD-10-CM | POA: Diagnosis not present

## 2017-07-30 DIAGNOSIS — Z471 Aftercare following joint replacement surgery: Secondary | ICD-10-CM | POA: Diagnosis not present

## 2017-07-30 DIAGNOSIS — Z96651 Presence of right artificial knee joint: Secondary | ICD-10-CM | POA: Diagnosis not present

## 2017-07-30 DIAGNOSIS — M6281 Muscle weakness (generalized): Secondary | ICD-10-CM | POA: Diagnosis not present

## 2017-07-30 DIAGNOSIS — R262 Difficulty in walking, not elsewhere classified: Secondary | ICD-10-CM | POA: Diagnosis not present

## 2017-08-06 DIAGNOSIS — R262 Difficulty in walking, not elsewhere classified: Secondary | ICD-10-CM | POA: Diagnosis not present

## 2017-08-06 DIAGNOSIS — M6281 Muscle weakness (generalized): Secondary | ICD-10-CM | POA: Diagnosis not present

## 2017-08-06 DIAGNOSIS — Z471 Aftercare following joint replacement surgery: Secondary | ICD-10-CM | POA: Diagnosis not present

## 2017-08-06 DIAGNOSIS — Z96651 Presence of right artificial knee joint: Secondary | ICD-10-CM | POA: Diagnosis not present

## 2017-08-12 DIAGNOSIS — Z09 Encounter for follow-up examination after completed treatment for conditions other than malignant neoplasm: Secondary | ICD-10-CM | POA: Diagnosis not present

## 2017-08-12 DIAGNOSIS — Z85830 Personal history of malignant neoplasm of bone: Secondary | ICD-10-CM | POA: Diagnosis not present

## 2017-08-12 DIAGNOSIS — S82201G Unspecified fracture of shaft of right tibia, subsequent encounter for closed fracture with delayed healing: Secondary | ICD-10-CM | POA: Diagnosis not present

## 2017-08-12 DIAGNOSIS — Z96651 Presence of right artificial knee joint: Secondary | ICD-10-CM | POA: Diagnosis not present

## 2017-08-12 DIAGNOSIS — T8484XD Pain due to internal orthopedic prosthetic devices, implants and grafts, subsequent encounter: Secondary | ICD-10-CM | POA: Diagnosis not present

## 2017-08-13 DIAGNOSIS — Z96651 Presence of right artificial knee joint: Secondary | ICD-10-CM | POA: Diagnosis not present

## 2017-08-13 DIAGNOSIS — M6281 Muscle weakness (generalized): Secondary | ICD-10-CM | POA: Diagnosis not present

## 2017-08-13 DIAGNOSIS — R262 Difficulty in walking, not elsewhere classified: Secondary | ICD-10-CM | POA: Diagnosis not present

## 2017-08-13 DIAGNOSIS — Z471 Aftercare following joint replacement surgery: Secondary | ICD-10-CM | POA: Diagnosis not present

## 2017-09-03 DIAGNOSIS — E559 Vitamin D deficiency, unspecified: Secondary | ICD-10-CM | POA: Diagnosis not present

## 2017-09-03 DIAGNOSIS — K5900 Constipation, unspecified: Secondary | ICD-10-CM | POA: Diagnosis not present

## 2017-09-03 DIAGNOSIS — R739 Hyperglycemia, unspecified: Secondary | ICD-10-CM | POA: Diagnosis not present

## 2017-09-03 DIAGNOSIS — M25569 Pain in unspecified knee: Secondary | ICD-10-CM | POA: Diagnosis not present

## 2017-09-03 DIAGNOSIS — E785 Hyperlipidemia, unspecified: Secondary | ICD-10-CM | POA: Diagnosis not present

## 2017-09-03 DIAGNOSIS — Z79899 Other long term (current) drug therapy: Secondary | ICD-10-CM | POA: Diagnosis not present

## 2017-09-03 DIAGNOSIS — E349 Endocrine disorder, unspecified: Secondary | ICD-10-CM | POA: Diagnosis not present

## 2017-09-11 DIAGNOSIS — T8484XD Pain due to internal orthopedic prosthetic devices, implants and grafts, subsequent encounter: Secondary | ICD-10-CM | POA: Diagnosis not present

## 2018-09-12 DIAGNOSIS — I10 Essential (primary) hypertension: Secondary | ICD-10-CM | POA: Diagnosis not present

## 2018-09-12 DIAGNOSIS — E559 Vitamin D deficiency, unspecified: Secondary | ICD-10-CM | POA: Diagnosis not present

## 2018-09-12 DIAGNOSIS — R739 Hyperglycemia, unspecified: Secondary | ICD-10-CM | POA: Diagnosis not present

## 2018-09-12 DIAGNOSIS — E349 Endocrine disorder, unspecified: Secondary | ICD-10-CM | POA: Diagnosis not present

## 2018-09-12 DIAGNOSIS — Z79899 Other long term (current) drug therapy: Secondary | ICD-10-CM | POA: Diagnosis not present

## 2018-09-12 DIAGNOSIS — E785 Hyperlipidemia, unspecified: Secondary | ICD-10-CM | POA: Diagnosis not present

## 2018-10-17 DIAGNOSIS — S82201G Unspecified fracture of shaft of right tibia, subsequent encounter for closed fracture with delayed healing: Secondary | ICD-10-CM | POA: Diagnosis not present

## 2018-10-23 DIAGNOSIS — S82209K Unspecified fracture of shaft of unspecified tibia, subsequent encounter for closed fracture with nonunion: Secondary | ICD-10-CM | POA: Diagnosis not present

## 2018-10-23 DIAGNOSIS — X58XXXD Exposure to other specified factors, subsequent encounter: Secondary | ICD-10-CM | POA: Diagnosis not present

## 2018-10-23 DIAGNOSIS — S82201G Unspecified fracture of shaft of right tibia, subsequent encounter for closed fracture with delayed healing: Secondary | ICD-10-CM | POA: Diagnosis not present

## 2018-10-23 DIAGNOSIS — M1712 Unilateral primary osteoarthritis, left knee: Secondary | ICD-10-CM | POA: Diagnosis not present

## 2018-11-19 DIAGNOSIS — S82201G Unspecified fracture of shaft of right tibia, subsequent encounter for closed fracture with delayed healing: Secondary | ICD-10-CM | POA: Diagnosis not present

## 2018-11-26 DIAGNOSIS — Z85830 Personal history of malignant neoplasm of bone: Secondary | ICD-10-CM | POA: Diagnosis not present

## 2018-11-26 DIAGNOSIS — S82291K Other fracture of shaft of right tibia, subsequent encounter for closed fracture with nonunion: Secondary | ICD-10-CM | POA: Diagnosis not present

## 2018-11-26 DIAGNOSIS — Z79891 Long term (current) use of opiate analgesic: Secondary | ICD-10-CM | POA: Diagnosis not present

## 2018-11-26 DIAGNOSIS — M9711XA Periprosthetic fracture around internal prosthetic right knee joint, initial encounter: Secondary | ICD-10-CM | POA: Diagnosis not present

## 2018-11-26 DIAGNOSIS — S82221K Displaced transverse fracture of shaft of right tibia, subsequent encounter for closed fracture with nonunion: Secondary | ICD-10-CM | POA: Diagnosis not present

## 2018-11-26 DIAGNOSIS — M1712 Unilateral primary osteoarthritis, left knee: Secondary | ICD-10-CM | POA: Diagnosis not present

## 2018-11-26 DIAGNOSIS — Z6841 Body Mass Index (BMI) 40.0 and over, adult: Secondary | ICD-10-CM | POA: Diagnosis not present

## 2018-11-26 DIAGNOSIS — Z880 Allergy status to penicillin: Secondary | ICD-10-CM | POA: Diagnosis not present

## 2018-11-26 DIAGNOSIS — S82201G Unspecified fracture of shaft of right tibia, subsequent encounter for closed fracture with delayed healing: Secondary | ICD-10-CM | POA: Diagnosis not present

## 2018-11-26 DIAGNOSIS — S82201K Unspecified fracture of shaft of right tibia, subsequent encounter for closed fracture with nonunion: Secondary | ICD-10-CM | POA: Diagnosis not present

## 2018-11-26 DIAGNOSIS — K219 Gastro-esophageal reflux disease without esophagitis: Secondary | ICD-10-CM | POA: Diagnosis not present

## 2018-11-26 DIAGNOSIS — Z4789 Encounter for other orthopedic aftercare: Secondary | ICD-10-CM | POA: Diagnosis not present

## 2018-11-26 DIAGNOSIS — S82201D Unspecified fracture of shaft of right tibia, subsequent encounter for closed fracture with routine healing: Secondary | ICD-10-CM | POA: Diagnosis not present

## 2018-11-26 DIAGNOSIS — I10 Essential (primary) hypertension: Secondary | ICD-10-CM | POA: Diagnosis not present

## 2018-11-26 DIAGNOSIS — Z96651 Presence of right artificial knee joint: Secondary | ICD-10-CM | POA: Diagnosis not present

## 2018-11-26 DIAGNOSIS — Z79899 Other long term (current) drug therapy: Secondary | ICD-10-CM | POA: Diagnosis not present

## 2018-11-26 DIAGNOSIS — S82251D Displaced comminuted fracture of shaft of right tibia, subsequent encounter for closed fracture with routine healing: Secondary | ICD-10-CM | POA: Diagnosis not present

## 2018-12-12 DIAGNOSIS — Z79899 Other long term (current) drug therapy: Secondary | ICD-10-CM | POA: Diagnosis not present

## 2018-12-12 DIAGNOSIS — R739 Hyperglycemia, unspecified: Secondary | ICD-10-CM | POA: Diagnosis not present

## 2018-12-12 DIAGNOSIS — E349 Endocrine disorder, unspecified: Secondary | ICD-10-CM | POA: Diagnosis not present

## 2018-12-12 DIAGNOSIS — E785 Hyperlipidemia, unspecified: Secondary | ICD-10-CM | POA: Diagnosis not present

## 2018-12-12 DIAGNOSIS — E559 Vitamin D deficiency, unspecified: Secondary | ICD-10-CM | POA: Diagnosis not present

## 2018-12-12 DIAGNOSIS — Z1331 Encounter for screening for depression: Secondary | ICD-10-CM | POA: Diagnosis not present

## 2018-12-15 DIAGNOSIS — S82251D Displaced comminuted fracture of shaft of right tibia, subsequent encounter for closed fracture with routine healing: Secondary | ICD-10-CM | POA: Diagnosis not present

## 2018-12-15 DIAGNOSIS — S82201G Unspecified fracture of shaft of right tibia, subsequent encounter for closed fracture with delayed healing: Secondary | ICD-10-CM | POA: Diagnosis not present

## 2019-01-26 DIAGNOSIS — S82201G Unspecified fracture of shaft of right tibia, subsequent encounter for closed fracture with delayed healing: Secondary | ICD-10-CM | POA: Diagnosis not present

## 2019-01-26 DIAGNOSIS — S82251A Displaced comminuted fracture of shaft of right tibia, initial encounter for closed fracture: Secondary | ICD-10-CM | POA: Diagnosis not present

## 2019-01-28 DIAGNOSIS — R739 Hyperglycemia, unspecified: Secondary | ICD-10-CM | POA: Diagnosis not present

## 2019-01-28 DIAGNOSIS — Z1331 Encounter for screening for depression: Secondary | ICD-10-CM | POA: Diagnosis not present

## 2019-01-28 DIAGNOSIS — I10 Essential (primary) hypertension: Secondary | ICD-10-CM | POA: Diagnosis not present

## 2019-01-28 DIAGNOSIS — Z79899 Other long term (current) drug therapy: Secondary | ICD-10-CM | POA: Diagnosis not present

## 2019-01-28 DIAGNOSIS — E785 Hyperlipidemia, unspecified: Secondary | ICD-10-CM | POA: Diagnosis not present

## 2019-01-28 DIAGNOSIS — F419 Anxiety disorder, unspecified: Secondary | ICD-10-CM | POA: Diagnosis not present

## 2019-04-10 DIAGNOSIS — S82201G Unspecified fracture of shaft of right tibia, subsequent encounter for closed fracture with delayed healing: Secondary | ICD-10-CM | POA: Diagnosis not present

## 2019-04-10 DIAGNOSIS — Z79899 Other long term (current) drug therapy: Secondary | ICD-10-CM | POA: Diagnosis not present

## 2019-04-10 DIAGNOSIS — E559 Vitamin D deficiency, unspecified: Secondary | ICD-10-CM | POA: Diagnosis not present

## 2019-04-10 DIAGNOSIS — R5383 Other fatigue: Secondary | ICD-10-CM | POA: Diagnosis not present

## 2019-04-10 DIAGNOSIS — R739 Hyperglycemia, unspecified: Secondary | ICD-10-CM | POA: Diagnosis not present

## 2019-04-10 DIAGNOSIS — E349 Endocrine disorder, unspecified: Secondary | ICD-10-CM | POA: Diagnosis not present

## 2019-04-10 DIAGNOSIS — M79604 Pain in right leg: Secondary | ICD-10-CM | POA: Diagnosis not present

## 2019-04-10 DIAGNOSIS — M7989 Other specified soft tissue disorders: Secondary | ICD-10-CM | POA: Diagnosis not present

## 2019-04-10 DIAGNOSIS — E785 Hyperlipidemia, unspecified: Secondary | ICD-10-CM | POA: Diagnosis not present

## 2019-05-08 DIAGNOSIS — F419 Anxiety disorder, unspecified: Secondary | ICD-10-CM | POA: Diagnosis not present

## 2019-05-08 DIAGNOSIS — E559 Vitamin D deficiency, unspecified: Secondary | ICD-10-CM | POA: Diagnosis not present

## 2019-06-09 DIAGNOSIS — F419 Anxiety disorder, unspecified: Secondary | ICD-10-CM | POA: Diagnosis not present

## 2019-06-09 DIAGNOSIS — K047 Periapical abscess without sinus: Secondary | ICD-10-CM | POA: Diagnosis not present

## 2019-06-09 DIAGNOSIS — I10 Essential (primary) hypertension: Secondary | ICD-10-CM | POA: Diagnosis not present

## 2019-06-09 DIAGNOSIS — M1990 Unspecified osteoarthritis, unspecified site: Secondary | ICD-10-CM | POA: Diagnosis not present

## 2019-06-15 DIAGNOSIS — T84498A Other mechanical complication of other internal orthopedic devices, implants and grafts, initial encounter: Secondary | ICD-10-CM | POA: Diagnosis not present

## 2019-06-15 DIAGNOSIS — M79604 Pain in right leg: Secondary | ICD-10-CM | POA: Diagnosis not present

## 2019-06-15 DIAGNOSIS — S82201A Unspecified fracture of shaft of right tibia, initial encounter for closed fracture: Secondary | ICD-10-CM | POA: Diagnosis not present

## 2019-06-15 DIAGNOSIS — S82201G Unspecified fracture of shaft of right tibia, subsequent encounter for closed fracture with delayed healing: Secondary | ICD-10-CM | POA: Diagnosis not present

## 2019-06-15 DIAGNOSIS — Z09 Encounter for follow-up examination after completed treatment for conditions other than malignant neoplasm: Secondary | ICD-10-CM | POA: Diagnosis not present

## 2019-07-03 DIAGNOSIS — M79661 Pain in right lower leg: Secondary | ICD-10-CM | POA: Diagnosis not present

## 2019-07-03 DIAGNOSIS — T84498A Other mechanical complication of other internal orthopedic devices, implants and grafts, initial encounter: Secondary | ICD-10-CM | POA: Diagnosis not present

## 2019-07-03 DIAGNOSIS — S82201G Unspecified fracture of shaft of right tibia, subsequent encounter for closed fracture with delayed healing: Secondary | ICD-10-CM | POA: Diagnosis not present

## 2019-07-03 DIAGNOSIS — M79604 Pain in right leg: Secondary | ICD-10-CM | POA: Diagnosis not present

## 2019-07-08 DIAGNOSIS — S82254G Nondisplaced comminuted fracture of shaft of right tibia, subsequent encounter for closed fracture with delayed healing: Secondary | ICD-10-CM | POA: Diagnosis not present

## 2019-07-08 DIAGNOSIS — T84498A Other mechanical complication of other internal orthopedic devices, implants and grafts, initial encounter: Secondary | ICD-10-CM | POA: Diagnosis not present

## 2019-07-08 DIAGNOSIS — S82201G Unspecified fracture of shaft of right tibia, subsequent encounter for closed fracture with delayed healing: Secondary | ICD-10-CM | POA: Diagnosis not present

## 2019-07-12 DIAGNOSIS — Z01818 Encounter for other preprocedural examination: Secondary | ICD-10-CM | POA: Diagnosis not present

## 2019-07-15 DIAGNOSIS — S82201G Unspecified fracture of shaft of right tibia, subsequent encounter for closed fracture with delayed healing: Secondary | ICD-10-CM | POA: Diagnosis not present

## 2019-07-15 DIAGNOSIS — Z79899 Other long term (current) drug therapy: Secondary | ICD-10-CM | POA: Diagnosis not present

## 2019-07-15 DIAGNOSIS — Z9889 Other specified postprocedural states: Secondary | ICD-10-CM | POA: Diagnosis not present

## 2019-07-15 DIAGNOSIS — Z1889 Other specified retained foreign body fragments: Secondary | ICD-10-CM | POA: Diagnosis not present

## 2019-07-15 DIAGNOSIS — T84498A Other mechanical complication of other internal orthopedic devices, implants and grafts, initial encounter: Secondary | ICD-10-CM | POA: Diagnosis not present

## 2019-07-15 DIAGNOSIS — Z85830 Personal history of malignant neoplasm of bone: Secondary | ICD-10-CM | POA: Diagnosis not present

## 2019-07-15 DIAGNOSIS — T84116A Breakdown (mechanical) of internal fixation device of bone of right lower leg, initial encounter: Secondary | ICD-10-CM | POA: Diagnosis not present

## 2019-07-15 DIAGNOSIS — M9711XD Periprosthetic fracture around internal prosthetic right knee joint, subsequent encounter: Secondary | ICD-10-CM | POA: Diagnosis not present

## 2019-07-15 DIAGNOSIS — S82191K Other fracture of upper end of right tibia, subsequent encounter for closed fracture with nonunion: Secondary | ICD-10-CM | POA: Diagnosis not present

## 2019-07-15 DIAGNOSIS — K219 Gastro-esophageal reflux disease without esophagitis: Secondary | ICD-10-CM | POA: Diagnosis not present

## 2019-07-15 DIAGNOSIS — M96671 Fracture of tibia or fibula following insertion of orthopedic implant, joint prosthesis, or bone plate, right leg: Secondary | ICD-10-CM | POA: Diagnosis not present

## 2019-07-15 DIAGNOSIS — I10 Essential (primary) hypertension: Secondary | ICD-10-CM | POA: Diagnosis not present

## 2019-07-15 DIAGNOSIS — S82201D Unspecified fracture of shaft of right tibia, subsequent encounter for closed fracture with routine healing: Secondary | ICD-10-CM | POA: Diagnosis not present

## 2019-07-15 DIAGNOSIS — M1990 Unspecified osteoarthritis, unspecified site: Secondary | ICD-10-CM | POA: Diagnosis not present

## 2019-07-15 DIAGNOSIS — Z791 Long term (current) use of non-steroidal anti-inflammatories (NSAID): Secondary | ICD-10-CM | POA: Diagnosis not present

## 2019-07-15 DIAGNOSIS — B370 Candidal stomatitis: Secondary | ICD-10-CM | POA: Diagnosis not present

## 2019-07-15 DIAGNOSIS — Z6841 Body Mass Index (BMI) 40.0 and over, adult: Secondary | ICD-10-CM | POA: Diagnosis not present

## 2019-08-03 DIAGNOSIS — M7989 Other specified soft tissue disorders: Secondary | ICD-10-CM | POA: Diagnosis not present

## 2019-08-03 DIAGNOSIS — Z09 Encounter for follow-up examination after completed treatment for conditions other than malignant neoplasm: Secondary | ICD-10-CM | POA: Diagnosis not present

## 2019-08-07 DIAGNOSIS — Z945 Skin transplant status: Secondary | ICD-10-CM | POA: Diagnosis not present

## 2019-08-07 DIAGNOSIS — S82251K Displaced comminuted fracture of shaft of right tibia, subsequent encounter for closed fracture with nonunion: Secondary | ICD-10-CM | POA: Diagnosis not present

## 2019-08-07 DIAGNOSIS — R739 Hyperglycemia, unspecified: Secondary | ICD-10-CM | POA: Diagnosis not present

## 2019-08-07 DIAGNOSIS — M25569 Pain in unspecified knee: Secondary | ICD-10-CM | POA: Diagnosis not present

## 2019-09-07 DIAGNOSIS — S82201D Unspecified fracture of shaft of right tibia, subsequent encounter for closed fracture with routine healing: Secondary | ICD-10-CM | POA: Diagnosis not present

## 2019-09-07 DIAGNOSIS — Z09 Encounter for follow-up examination after completed treatment for conditions other than malignant neoplasm: Secondary | ICD-10-CM | POA: Diagnosis not present

## 2019-09-10 DIAGNOSIS — E785 Hyperlipidemia, unspecified: Secondary | ICD-10-CM | POA: Diagnosis not present

## 2019-09-10 DIAGNOSIS — S82251K Displaced comminuted fracture of shaft of right tibia, subsequent encounter for closed fracture with nonunion: Secondary | ICD-10-CM | POA: Diagnosis not present

## 2019-09-10 DIAGNOSIS — Z79899 Other long term (current) drug therapy: Secondary | ICD-10-CM | POA: Diagnosis not present

## 2019-09-10 DIAGNOSIS — R739 Hyperglycemia, unspecified: Secondary | ICD-10-CM | POA: Diagnosis not present

## 2019-09-10 DIAGNOSIS — E349 Endocrine disorder, unspecified: Secondary | ICD-10-CM | POA: Diagnosis not present

## 2019-11-09 DIAGNOSIS — Z09 Encounter for follow-up examination after completed treatment for conditions other than malignant neoplasm: Secondary | ICD-10-CM | POA: Diagnosis not present

## 2019-11-09 DIAGNOSIS — S82201D Unspecified fracture of shaft of right tibia, subsequent encounter for closed fracture with routine healing: Secondary | ICD-10-CM | POA: Diagnosis not present

## 2019-11-09 DIAGNOSIS — S82201G Unspecified fracture of shaft of right tibia, subsequent encounter for closed fracture with delayed healing: Secondary | ICD-10-CM | POA: Diagnosis not present

## 2019-11-24 DIAGNOSIS — E349 Endocrine disorder, unspecified: Secondary | ICD-10-CM | POA: Diagnosis not present

## 2019-11-24 DIAGNOSIS — Z6841 Body Mass Index (BMI) 40.0 and over, adult: Secondary | ICD-10-CM | POA: Diagnosis not present

## 2019-11-24 DIAGNOSIS — R739 Hyperglycemia, unspecified: Secondary | ICD-10-CM | POA: Diagnosis not present

## 2019-11-24 DIAGNOSIS — M25569 Pain in unspecified knee: Secondary | ICD-10-CM | POA: Diagnosis not present

## 2019-11-24 DIAGNOSIS — Z0000 Encounter for general adult medical examination without abnormal findings: Secondary | ICD-10-CM | POA: Diagnosis not present

## 2019-11-24 DIAGNOSIS — E559 Vitamin D deficiency, unspecified: Secondary | ICD-10-CM | POA: Diagnosis not present

## 2020-01-08 DIAGNOSIS — Z96659 Presence of unspecified artificial knee joint: Secondary | ICD-10-CM | POA: Diagnosis not present

## 2020-01-08 DIAGNOSIS — M7989 Other specified soft tissue disorders: Secondary | ICD-10-CM | POA: Diagnosis not present

## 2020-01-08 DIAGNOSIS — S82201G Unspecified fracture of shaft of right tibia, subsequent encounter for closed fracture with delayed healing: Secondary | ICD-10-CM | POA: Diagnosis not present

## 2020-01-08 DIAGNOSIS — Z09 Encounter for follow-up examination after completed treatment for conditions other than malignant neoplasm: Secondary | ICD-10-CM | POA: Diagnosis not present

## 2020-02-17 DIAGNOSIS — S82202A Unspecified fracture of shaft of left tibia, initial encounter for closed fracture: Secondary | ICD-10-CM | POA: Diagnosis not present

## 2020-02-17 DIAGNOSIS — M25562 Pain in left knee: Secondary | ICD-10-CM | POA: Diagnosis not present

## 2020-02-17 DIAGNOSIS — M1712 Unilateral primary osteoarthritis, left knee: Secondary | ICD-10-CM | POA: Diagnosis not present

## 2020-02-23 DIAGNOSIS — E785 Hyperlipidemia, unspecified: Secondary | ICD-10-CM | POA: Diagnosis not present

## 2020-02-23 DIAGNOSIS — E349 Endocrine disorder, unspecified: Secondary | ICD-10-CM | POA: Diagnosis not present

## 2020-02-23 DIAGNOSIS — E559 Vitamin D deficiency, unspecified: Secondary | ICD-10-CM | POA: Diagnosis not present

## 2020-02-23 DIAGNOSIS — I10 Essential (primary) hypertension: Secondary | ICD-10-CM | POA: Diagnosis not present

## 2020-02-23 DIAGNOSIS — M1990 Unspecified osteoarthritis, unspecified site: Secondary | ICD-10-CM | POA: Diagnosis not present

## 2020-02-23 DIAGNOSIS — Z79899 Other long term (current) drug therapy: Secondary | ICD-10-CM | POA: Diagnosis not present

## 2020-02-23 DIAGNOSIS — Z6841 Body Mass Index (BMI) 40.0 and over, adult: Secondary | ICD-10-CM | POA: Diagnosis not present

## 2020-02-23 DIAGNOSIS — R739 Hyperglycemia, unspecified: Secondary | ICD-10-CM | POA: Diagnosis not present

## 2020-02-23 DIAGNOSIS — Z1331 Encounter for screening for depression: Secondary | ICD-10-CM | POA: Diagnosis not present

## 2020-02-24 DIAGNOSIS — M1712 Unilateral primary osteoarthritis, left knee: Secondary | ICD-10-CM | POA: Diagnosis not present

## 2020-03-22 DIAGNOSIS — Z0000 Encounter for general adult medical examination without abnormal findings: Secondary | ICD-10-CM | POA: Diagnosis not present

## 2020-03-22 DIAGNOSIS — Z9181 History of falling: Secondary | ICD-10-CM | POA: Diagnosis not present

## 2020-03-22 DIAGNOSIS — Z6841 Body Mass Index (BMI) 40.0 and over, adult: Secondary | ICD-10-CM | POA: Diagnosis not present

## 2020-03-22 DIAGNOSIS — E785 Hyperlipidemia, unspecified: Secondary | ICD-10-CM | POA: Diagnosis not present

## 2020-03-22 DIAGNOSIS — Z1331 Encounter for screening for depression: Secondary | ICD-10-CM | POA: Diagnosis not present

## 2020-04-20 DIAGNOSIS — S82202H Unspecified fracture of shaft of left tibia, subsequent encounter for open fracture type I or II with delayed healing: Secondary | ICD-10-CM | POA: Diagnosis not present

## 2020-04-20 DIAGNOSIS — S82201G Unspecified fracture of shaft of right tibia, subsequent encounter for closed fracture with delayed healing: Secondary | ICD-10-CM | POA: Diagnosis not present

## 2020-05-26 DIAGNOSIS — Z79899 Other long term (current) drug therapy: Secondary | ICD-10-CM | POA: Diagnosis not present

## 2020-05-26 DIAGNOSIS — Z6841 Body Mass Index (BMI) 40.0 and over, adult: Secondary | ICD-10-CM | POA: Diagnosis not present

## 2020-05-26 DIAGNOSIS — E349 Endocrine disorder, unspecified: Secondary | ICD-10-CM | POA: Diagnosis not present

## 2020-05-26 DIAGNOSIS — R739 Hyperglycemia, unspecified: Secondary | ICD-10-CM | POA: Diagnosis not present

## 2020-05-26 DIAGNOSIS — I10 Essential (primary) hypertension: Secondary | ICD-10-CM | POA: Diagnosis not present

## 2020-05-26 DIAGNOSIS — E559 Vitamin D deficiency, unspecified: Secondary | ICD-10-CM | POA: Diagnosis not present

## 2020-05-26 DIAGNOSIS — E785 Hyperlipidemia, unspecified: Secondary | ICD-10-CM | POA: Diagnosis not present

## 2020-05-26 DIAGNOSIS — M1990 Unspecified osteoarthritis, unspecified site: Secondary | ICD-10-CM | POA: Diagnosis not present

## 2020-05-26 DIAGNOSIS — Z125 Encounter for screening for malignant neoplasm of prostate: Secondary | ICD-10-CM | POA: Diagnosis not present

## 2020-07-29 DIAGNOSIS — M1712 Unilateral primary osteoarthritis, left knee: Secondary | ICD-10-CM | POA: Diagnosis not present

## 2020-07-29 DIAGNOSIS — Z09 Encounter for follow-up examination after completed treatment for conditions other than malignant neoplasm: Secondary | ICD-10-CM | POA: Diagnosis not present

## 2020-07-29 DIAGNOSIS — S82291D Other fracture of shaft of right tibia, subsequent encounter for closed fracture with routine healing: Secondary | ICD-10-CM | POA: Diagnosis not present

## 2020-07-29 DIAGNOSIS — S82201G Unspecified fracture of shaft of right tibia, subsequent encounter for closed fracture with delayed healing: Secondary | ICD-10-CM | POA: Diagnosis not present

## 2020-07-29 DIAGNOSIS — M7989 Other specified soft tissue disorders: Secondary | ICD-10-CM | POA: Diagnosis not present

## 2020-08-26 DIAGNOSIS — M25569 Pain in unspecified knee: Secondary | ICD-10-CM | POA: Diagnosis not present

## 2020-08-26 DIAGNOSIS — E785 Hyperlipidemia, unspecified: Secondary | ICD-10-CM | POA: Diagnosis not present

## 2020-08-26 DIAGNOSIS — Z79899 Other long term (current) drug therapy: Secondary | ICD-10-CM | POA: Diagnosis not present

## 2020-08-26 DIAGNOSIS — R739 Hyperglycemia, unspecified: Secondary | ICD-10-CM | POA: Diagnosis not present

## 2020-08-26 DIAGNOSIS — I10 Essential (primary) hypertension: Secondary | ICD-10-CM | POA: Diagnosis not present

## 2020-08-26 DIAGNOSIS — E559 Vitamin D deficiency, unspecified: Secondary | ICD-10-CM | POA: Diagnosis not present

## 2020-08-26 DIAGNOSIS — Z6841 Body Mass Index (BMI) 40.0 and over, adult: Secondary | ICD-10-CM | POA: Diagnosis not present

## 2020-08-26 DIAGNOSIS — E349 Endocrine disorder, unspecified: Secondary | ICD-10-CM | POA: Diagnosis not present

## 2020-10-06 DIAGNOSIS — L820 Inflamed seborrheic keratosis: Secondary | ICD-10-CM | POA: Diagnosis not present

## 2020-10-06 DIAGNOSIS — D485 Neoplasm of uncertain behavior of skin: Secondary | ICD-10-CM | POA: Diagnosis not present

## 2020-10-18 DIAGNOSIS — M1712 Unilateral primary osteoarthritis, left knee: Secondary | ICD-10-CM | POA: Diagnosis not present

## 2020-10-25 DIAGNOSIS — M1712 Unilateral primary osteoarthritis, left knee: Secondary | ICD-10-CM | POA: Diagnosis not present

## 2020-10-26 DIAGNOSIS — J069 Acute upper respiratory infection, unspecified: Secondary | ICD-10-CM | POA: Diagnosis not present

## 2020-10-26 DIAGNOSIS — M25569 Pain in unspecified knee: Secondary | ICD-10-CM | POA: Diagnosis not present

## 2020-10-26 DIAGNOSIS — Z6841 Body Mass Index (BMI) 40.0 and over, adult: Secondary | ICD-10-CM | POA: Diagnosis not present

## 2020-11-02 DIAGNOSIS — M1712 Unilateral primary osteoarthritis, left knee: Secondary | ICD-10-CM | POA: Diagnosis not present

## 2020-11-24 DIAGNOSIS — L820 Inflamed seborrheic keratosis: Secondary | ICD-10-CM | POA: Diagnosis not present

## 2020-11-24 DIAGNOSIS — L918 Other hypertrophic disorders of the skin: Secondary | ICD-10-CM | POA: Diagnosis not present

## 2020-11-24 DIAGNOSIS — D225 Melanocytic nevi of trunk: Secondary | ICD-10-CM | POA: Diagnosis not present

## 2020-11-28 DIAGNOSIS — R739 Hyperglycemia, unspecified: Secondary | ICD-10-CM | POA: Diagnosis not present

## 2020-11-28 DIAGNOSIS — M25569 Pain in unspecified knee: Secondary | ICD-10-CM | POA: Diagnosis not present

## 2020-11-28 DIAGNOSIS — E785 Hyperlipidemia, unspecified: Secondary | ICD-10-CM | POA: Diagnosis not present

## 2020-11-28 DIAGNOSIS — E349 Endocrine disorder, unspecified: Secondary | ICD-10-CM | POA: Diagnosis not present

## 2020-11-28 DIAGNOSIS — Z6841 Body Mass Index (BMI) 40.0 and over, adult: Secondary | ICD-10-CM | POA: Diagnosis not present

## 2020-11-28 DIAGNOSIS — I10 Essential (primary) hypertension: Secondary | ICD-10-CM | POA: Diagnosis not present

## 2020-11-28 DIAGNOSIS — Z79899 Other long term (current) drug therapy: Secondary | ICD-10-CM | POA: Diagnosis not present

## 2020-11-28 DIAGNOSIS — E559 Vitamin D deficiency, unspecified: Secondary | ICD-10-CM | POA: Diagnosis not present

## 2020-12-30 DIAGNOSIS — S82201G Unspecified fracture of shaft of right tibia, subsequent encounter for closed fracture with delayed healing: Secondary | ICD-10-CM | POA: Diagnosis not present

## 2020-12-30 DIAGNOSIS — Z09 Encounter for follow-up examination after completed treatment for conditions other than malignant neoplasm: Secondary | ICD-10-CM | POA: Diagnosis not present

## 2020-12-30 DIAGNOSIS — S82254G Nondisplaced comminuted fracture of shaft of right tibia, subsequent encounter for closed fracture with delayed healing: Secondary | ICD-10-CM | POA: Diagnosis not present

## 2021-02-15 DIAGNOSIS — M79604 Pain in right leg: Secondary | ICD-10-CM | POA: Diagnosis not present

## 2021-02-15 DIAGNOSIS — Z6841 Body Mass Index (BMI) 40.0 and over, adult: Secondary | ICD-10-CM | POA: Diagnosis not present

## 2021-02-15 DIAGNOSIS — R12 Heartburn: Secondary | ICD-10-CM | POA: Diagnosis not present

## 2021-02-15 DIAGNOSIS — I10 Essential (primary) hypertension: Secondary | ICD-10-CM | POA: Diagnosis not present

## 2021-02-15 DIAGNOSIS — E559 Vitamin D deficiency, unspecified: Secondary | ICD-10-CM | POA: Diagnosis not present

## 2021-02-15 DIAGNOSIS — E785 Hyperlipidemia, unspecified: Secondary | ICD-10-CM | POA: Diagnosis not present

## 2021-02-15 DIAGNOSIS — Z79899 Other long term (current) drug therapy: Secondary | ICD-10-CM | POA: Diagnosis not present

## 2021-02-15 DIAGNOSIS — R739 Hyperglycemia, unspecified: Secondary | ICD-10-CM | POA: Diagnosis not present

## 2021-02-15 DIAGNOSIS — E349 Endocrine disorder, unspecified: Secondary | ICD-10-CM | POA: Diagnosis not present

## 2021-03-24 DIAGNOSIS — E785 Hyperlipidemia, unspecified: Secondary | ICD-10-CM | POA: Diagnosis not present

## 2021-03-24 DIAGNOSIS — Z9181 History of falling: Secondary | ICD-10-CM | POA: Diagnosis not present

## 2021-03-24 DIAGNOSIS — Z1331 Encounter for screening for depression: Secondary | ICD-10-CM | POA: Diagnosis not present

## 2021-03-24 DIAGNOSIS — E669 Obesity, unspecified: Secondary | ICD-10-CM | POA: Diagnosis not present

## 2021-03-24 DIAGNOSIS — Z0000 Encounter for general adult medical examination without abnormal findings: Secondary | ICD-10-CM | POA: Diagnosis not present

## 2021-05-11 DIAGNOSIS — M1712 Unilateral primary osteoarthritis, left knee: Secondary | ICD-10-CM | POA: Diagnosis not present

## 2021-05-18 DIAGNOSIS — M1712 Unilateral primary osteoarthritis, left knee: Secondary | ICD-10-CM | POA: Diagnosis not present

## 2021-05-23 DIAGNOSIS — R202 Paresthesia of skin: Secondary | ICD-10-CM | POA: Diagnosis not present

## 2021-05-23 DIAGNOSIS — E291 Testicular hypofunction: Secondary | ICD-10-CM | POA: Diagnosis not present

## 2021-05-23 DIAGNOSIS — E559 Vitamin D deficiency, unspecified: Secondary | ICD-10-CM | POA: Diagnosis not present

## 2021-05-23 DIAGNOSIS — R12 Heartburn: Secondary | ICD-10-CM | POA: Diagnosis not present

## 2021-05-23 DIAGNOSIS — Z79899 Other long term (current) drug therapy: Secondary | ICD-10-CM | POA: Diagnosis not present

## 2021-05-23 DIAGNOSIS — E785 Hyperlipidemia, unspecified: Secondary | ICD-10-CM | POA: Diagnosis not present

## 2021-05-23 DIAGNOSIS — R739 Hyperglycemia, unspecified: Secondary | ICD-10-CM | POA: Diagnosis not present

## 2021-05-23 DIAGNOSIS — M79604 Pain in right leg: Secondary | ICD-10-CM | POA: Diagnosis not present

## 2021-05-23 DIAGNOSIS — F419 Anxiety disorder, unspecified: Secondary | ICD-10-CM | POA: Diagnosis not present

## 2021-05-23 DIAGNOSIS — M79609 Pain in unspecified limb: Secondary | ICD-10-CM | POA: Diagnosis not present

## 2021-05-26 DIAGNOSIS — M1712 Unilateral primary osteoarthritis, left knee: Secondary | ICD-10-CM | POA: Diagnosis not present

## 2021-07-03 DIAGNOSIS — I10 Essential (primary) hypertension: Secondary | ICD-10-CM | POA: Diagnosis not present

## 2021-07-03 DIAGNOSIS — R9389 Abnormal findings on diagnostic imaging of other specified body structures: Secondary | ICD-10-CM | POA: Diagnosis not present

## 2021-07-03 DIAGNOSIS — M47816 Spondylosis without myelopathy or radiculopathy, lumbar region: Secondary | ICD-10-CM | POA: Diagnosis not present

## 2021-07-03 DIAGNOSIS — M25551 Pain in right hip: Secondary | ICD-10-CM | POA: Diagnosis not present

## 2021-07-03 DIAGNOSIS — M545 Low back pain, unspecified: Secondary | ICD-10-CM | POA: Diagnosis not present

## 2021-07-03 DIAGNOSIS — M5134 Other intervertebral disc degeneration, thoracic region: Secondary | ICD-10-CM | POA: Diagnosis not present

## 2021-07-07 DIAGNOSIS — M545 Low back pain, unspecified: Secondary | ICD-10-CM | POA: Diagnosis not present

## 2021-07-07 DIAGNOSIS — S82201G Unspecified fracture of shaft of right tibia, subsequent encounter for closed fracture with delayed healing: Secondary | ICD-10-CM | POA: Diagnosis not present

## 2021-07-24 DIAGNOSIS — J309 Allergic rhinitis, unspecified: Secondary | ICD-10-CM | POA: Diagnosis not present

## 2021-07-24 DIAGNOSIS — Z6841 Body Mass Index (BMI) 40.0 and over, adult: Secondary | ICD-10-CM | POA: Diagnosis not present

## 2021-07-24 DIAGNOSIS — J069 Acute upper respiratory infection, unspecified: Secondary | ICD-10-CM | POA: Diagnosis not present

## 2021-07-25 DIAGNOSIS — J309 Allergic rhinitis, unspecified: Secondary | ICD-10-CM | POA: Diagnosis not present

## 2021-08-03 DIAGNOSIS — H6590 Unspecified nonsuppurative otitis media, unspecified ear: Secondary | ICD-10-CM | POA: Diagnosis not present

## 2021-08-03 DIAGNOSIS — M25569 Pain in unspecified knee: Secondary | ICD-10-CM | POA: Diagnosis not present

## 2021-08-03 DIAGNOSIS — Z6841 Body Mass Index (BMI) 40.0 and over, adult: Secondary | ICD-10-CM | POA: Diagnosis not present

## 2021-08-23 DIAGNOSIS — M79604 Pain in right leg: Secondary | ICD-10-CM | POA: Diagnosis not present

## 2021-08-23 DIAGNOSIS — Z79899 Other long term (current) drug therapy: Secondary | ICD-10-CM | POA: Diagnosis not present

## 2021-08-23 DIAGNOSIS — R0609 Other forms of dyspnea: Secondary | ICD-10-CM | POA: Diagnosis not present

## 2021-08-23 DIAGNOSIS — R059 Cough, unspecified: Secondary | ICD-10-CM | POA: Diagnosis not present

## 2021-08-23 DIAGNOSIS — F419 Anxiety disorder, unspecified: Secondary | ICD-10-CM | POA: Diagnosis not present

## 2021-08-23 DIAGNOSIS — E291 Testicular hypofunction: Secondary | ICD-10-CM | POA: Diagnosis not present

## 2021-08-23 DIAGNOSIS — E559 Vitamin D deficiency, unspecified: Secondary | ICD-10-CM | POA: Diagnosis not present

## 2021-08-23 DIAGNOSIS — I10 Essential (primary) hypertension: Secondary | ICD-10-CM | POA: Diagnosis not present

## 2021-08-23 DIAGNOSIS — R739 Hyperglycemia, unspecified: Secondary | ICD-10-CM | POA: Diagnosis not present

## 2021-08-23 DIAGNOSIS — E785 Hyperlipidemia, unspecified: Secondary | ICD-10-CM | POA: Diagnosis not present

## 2021-08-30 DIAGNOSIS — M533 Sacrococcygeal disorders, not elsewhere classified: Secondary | ICD-10-CM | POA: Diagnosis not present

## 2021-08-30 DIAGNOSIS — M47816 Spondylosis without myelopathy or radiculopathy, lumbar region: Secondary | ICD-10-CM | POA: Diagnosis not present

## 2021-08-30 DIAGNOSIS — G894 Chronic pain syndrome: Secondary | ICD-10-CM | POA: Diagnosis not present

## 2021-09-13 DIAGNOSIS — R0609 Other forms of dyspnea: Secondary | ICD-10-CM | POA: Diagnosis not present

## 2021-09-14 DIAGNOSIS — R0609 Other forms of dyspnea: Secondary | ICD-10-CM | POA: Diagnosis not present

## 2021-09-19 DIAGNOSIS — R0609 Other forms of dyspnea: Secondary | ICD-10-CM | POA: Diagnosis not present

## 2021-09-26 DIAGNOSIS — R0609 Other forms of dyspnea: Secondary | ICD-10-CM | POA: Diagnosis not present

## 2021-10-12 DIAGNOSIS — G4719 Other hypersomnia: Secondary | ICD-10-CM | POA: Diagnosis not present

## 2021-10-12 DIAGNOSIS — Z6841 Body Mass Index (BMI) 40.0 and over, adult: Secondary | ICD-10-CM | POA: Diagnosis not present

## 2021-10-12 DIAGNOSIS — R0681 Apnea, not elsewhere classified: Secondary | ICD-10-CM | POA: Diagnosis not present

## 2021-10-12 DIAGNOSIS — R0683 Snoring: Secondary | ICD-10-CM | POA: Diagnosis not present

## 2021-10-24 DIAGNOSIS — I10 Essential (primary) hypertension: Secondary | ICD-10-CM | POA: Diagnosis not present

## 2021-10-24 DIAGNOSIS — E785 Hyperlipidemia, unspecified: Secondary | ICD-10-CM | POA: Diagnosis not present

## 2021-10-24 DIAGNOSIS — R0609 Other forms of dyspnea: Secondary | ICD-10-CM | POA: Diagnosis not present

## 2021-11-20 DIAGNOSIS — G4733 Obstructive sleep apnea (adult) (pediatric): Secondary | ICD-10-CM | POA: Diagnosis not present

## 2021-11-21 DIAGNOSIS — G4719 Other hypersomnia: Secondary | ICD-10-CM | POA: Diagnosis not present

## 2021-11-21 DIAGNOSIS — Z6841 Body Mass Index (BMI) 40.0 and over, adult: Secondary | ICD-10-CM | POA: Diagnosis not present

## 2021-11-21 DIAGNOSIS — G4733 Obstructive sleep apnea (adult) (pediatric): Secondary | ICD-10-CM | POA: Diagnosis not present

## 2021-11-22 DIAGNOSIS — E559 Vitamin D deficiency, unspecified: Secondary | ICD-10-CM | POA: Diagnosis not present

## 2021-11-22 DIAGNOSIS — I10 Essential (primary) hypertension: Secondary | ICD-10-CM | POA: Diagnosis not present

## 2021-11-22 DIAGNOSIS — R739 Hyperglycemia, unspecified: Secondary | ICD-10-CM | POA: Diagnosis not present

## 2021-11-22 DIAGNOSIS — Z6841 Body Mass Index (BMI) 40.0 and over, adult: Secondary | ICD-10-CM | POA: Diagnosis not present

## 2021-11-22 DIAGNOSIS — F419 Anxiety disorder, unspecified: Secondary | ICD-10-CM | POA: Diagnosis not present

## 2021-11-22 DIAGNOSIS — E785 Hyperlipidemia, unspecified: Secondary | ICD-10-CM | POA: Diagnosis not present

## 2021-11-22 DIAGNOSIS — G4730 Sleep apnea, unspecified: Secondary | ICD-10-CM | POA: Diagnosis not present

## 2021-11-22 DIAGNOSIS — Z79899 Other long term (current) drug therapy: Secondary | ICD-10-CM | POA: Diagnosis not present

## 2021-11-22 DIAGNOSIS — M25569 Pain in unspecified knee: Secondary | ICD-10-CM | POA: Diagnosis not present

## 2021-12-06 DIAGNOSIS — J029 Acute pharyngitis, unspecified: Secondary | ICD-10-CM | POA: Diagnosis not present

## 2021-12-06 DIAGNOSIS — M25569 Pain in unspecified knee: Secondary | ICD-10-CM | POA: Diagnosis not present

## 2021-12-27 DIAGNOSIS — Z6841 Body Mass Index (BMI) 40.0 and over, adult: Secondary | ICD-10-CM | POA: Diagnosis not present

## 2021-12-27 DIAGNOSIS — J069 Acute upper respiratory infection, unspecified: Secondary | ICD-10-CM | POA: Diagnosis not present

## 2021-12-27 DIAGNOSIS — J309 Allergic rhinitis, unspecified: Secondary | ICD-10-CM | POA: Diagnosis not present

## 2022-01-12 DIAGNOSIS — Z6841 Body Mass Index (BMI) 40.0 and over, adult: Secondary | ICD-10-CM | POA: Diagnosis not present

## 2022-01-12 DIAGNOSIS — S82201G Unspecified fracture of shaft of right tibia, subsequent encounter for closed fracture with delayed healing: Secondary | ICD-10-CM | POA: Diagnosis not present

## 2022-01-12 DIAGNOSIS — T8484XD Pain due to internal orthopedic prosthetic devices, implants and grafts, subsequent encounter: Secondary | ICD-10-CM | POA: Diagnosis not present

## 2022-01-12 DIAGNOSIS — Z96651 Presence of right artificial knee joint: Secondary | ICD-10-CM | POA: Diagnosis not present

## 2022-02-20 DIAGNOSIS — Z6841 Body Mass Index (BMI) 40.0 and over, adult: Secondary | ICD-10-CM | POA: Diagnosis not present

## 2022-02-20 DIAGNOSIS — G4733 Obstructive sleep apnea (adult) (pediatric): Secondary | ICD-10-CM | POA: Diagnosis not present

## 2022-02-21 DIAGNOSIS — G4730 Sleep apnea, unspecified: Secondary | ICD-10-CM | POA: Diagnosis not present

## 2022-02-21 DIAGNOSIS — I10 Essential (primary) hypertension: Secondary | ICD-10-CM | POA: Diagnosis not present

## 2022-02-21 DIAGNOSIS — E785 Hyperlipidemia, unspecified: Secondary | ICD-10-CM | POA: Diagnosis not present

## 2022-02-21 DIAGNOSIS — E559 Vitamin D deficiency, unspecified: Secondary | ICD-10-CM | POA: Diagnosis not present

## 2022-02-21 DIAGNOSIS — Z6841 Body Mass Index (BMI) 40.0 and over, adult: Secondary | ICD-10-CM | POA: Diagnosis not present

## 2022-02-21 DIAGNOSIS — R739 Hyperglycemia, unspecified: Secondary | ICD-10-CM | POA: Diagnosis not present

## 2022-02-21 DIAGNOSIS — M25569 Pain in unspecified knee: Secondary | ICD-10-CM | POA: Diagnosis not present

## 2022-02-21 DIAGNOSIS — Z79899 Other long term (current) drug therapy: Secondary | ICD-10-CM | POA: Diagnosis not present

## 2022-05-29 DIAGNOSIS — I10 Essential (primary) hypertension: Secondary | ICD-10-CM | POA: Diagnosis not present

## 2022-05-29 DIAGNOSIS — E785 Hyperlipidemia, unspecified: Secondary | ICD-10-CM | POA: Diagnosis not present

## 2022-05-29 DIAGNOSIS — E559 Vitamin D deficiency, unspecified: Secondary | ICD-10-CM | POA: Diagnosis not present

## 2022-05-29 DIAGNOSIS — Z6841 Body Mass Index (BMI) 40.0 and over, adult: Secondary | ICD-10-CM | POA: Diagnosis not present

## 2022-05-29 DIAGNOSIS — M79604 Pain in right leg: Secondary | ICD-10-CM | POA: Diagnosis not present

## 2022-05-29 DIAGNOSIS — Z79899 Other long term (current) drug therapy: Secondary | ICD-10-CM | POA: Diagnosis not present

## 2022-05-29 DIAGNOSIS — F339 Major depressive disorder, recurrent, unspecified: Secondary | ICD-10-CM | POA: Diagnosis not present

## 2022-05-29 DIAGNOSIS — R739 Hyperglycemia, unspecified: Secondary | ICD-10-CM | POA: Diagnosis not present

## 2022-06-05 DIAGNOSIS — E785 Hyperlipidemia, unspecified: Secondary | ICD-10-CM | POA: Diagnosis not present

## 2022-06-05 DIAGNOSIS — E669 Obesity, unspecified: Secondary | ICD-10-CM | POA: Diagnosis not present

## 2022-06-05 DIAGNOSIS — Z6841 Body Mass Index (BMI) 40.0 and over, adult: Secondary | ICD-10-CM | POA: Diagnosis not present

## 2022-06-05 DIAGNOSIS — Z1331 Encounter for screening for depression: Secondary | ICD-10-CM | POA: Diagnosis not present

## 2022-06-05 DIAGNOSIS — Z0000 Encounter for general adult medical examination without abnormal findings: Secondary | ICD-10-CM | POA: Diagnosis not present

## 2022-06-05 DIAGNOSIS — Z9181 History of falling: Secondary | ICD-10-CM | POA: Diagnosis not present

## 2022-07-13 DIAGNOSIS — S82201G Unspecified fracture of shaft of right tibia, subsequent encounter for closed fracture with delayed healing: Secondary | ICD-10-CM | POA: Diagnosis not present

## 2022-07-13 DIAGNOSIS — Z09 Encounter for follow-up examination after completed treatment for conditions other than malignant neoplasm: Secondary | ICD-10-CM | POA: Diagnosis not present

## 2022-07-13 DIAGNOSIS — Z6841 Body Mass Index (BMI) 40.0 and over, adult: Secondary | ICD-10-CM | POA: Diagnosis not present

## 2022-07-13 DIAGNOSIS — M1712 Unilateral primary osteoarthritis, left knee: Secondary | ICD-10-CM | POA: Diagnosis not present

## 2022-07-23 DIAGNOSIS — Z6841 Body Mass Index (BMI) 40.0 and over, adult: Secondary | ICD-10-CM | POA: Diagnosis not present

## 2022-07-23 DIAGNOSIS — G4733 Obstructive sleep apnea (adult) (pediatric): Secondary | ICD-10-CM | POA: Diagnosis not present

## 2022-07-23 DIAGNOSIS — M1712 Unilateral primary osteoarthritis, left knee: Secondary | ICD-10-CM | POA: Diagnosis not present

## 2022-07-30 DIAGNOSIS — M1712 Unilateral primary osteoarthritis, left knee: Secondary | ICD-10-CM | POA: Diagnosis not present

## 2022-08-09 DIAGNOSIS — M1712 Unilateral primary osteoarthritis, left knee: Secondary | ICD-10-CM | POA: Diagnosis not present

## 2022-08-28 DIAGNOSIS — M1990 Unspecified osteoarthritis, unspecified site: Secondary | ICD-10-CM | POA: Diagnosis not present

## 2022-08-28 DIAGNOSIS — E785 Hyperlipidemia, unspecified: Secondary | ICD-10-CM | POA: Diagnosis not present

## 2022-08-28 DIAGNOSIS — G4730 Sleep apnea, unspecified: Secondary | ICD-10-CM | POA: Diagnosis not present

## 2022-08-28 DIAGNOSIS — Z79899 Other long term (current) drug therapy: Secondary | ICD-10-CM | POA: Diagnosis not present

## 2022-08-28 DIAGNOSIS — R739 Hyperglycemia, unspecified: Secondary | ICD-10-CM | POA: Diagnosis not present

## 2022-08-28 DIAGNOSIS — Z6841 Body Mass Index (BMI) 40.0 and over, adult: Secondary | ICD-10-CM | POA: Diagnosis not present

## 2022-08-28 DIAGNOSIS — E559 Vitamin D deficiency, unspecified: Secondary | ICD-10-CM | POA: Diagnosis not present

## 2022-10-17 DIAGNOSIS — G4733 Obstructive sleep apnea (adult) (pediatric): Secondary | ICD-10-CM | POA: Diagnosis not present

## 2022-10-17 DIAGNOSIS — Z6841 Body Mass Index (BMI) 40.0 and over, adult: Secondary | ICD-10-CM | POA: Diagnosis not present

## 2022-10-17 DIAGNOSIS — M1712 Unilateral primary osteoarthritis, left knee: Secondary | ICD-10-CM | POA: Diagnosis not present

## 2022-11-27 DIAGNOSIS — Z79899 Other long term (current) drug therapy: Secondary | ICD-10-CM | POA: Diagnosis not present

## 2022-11-27 DIAGNOSIS — Z6841 Body Mass Index (BMI) 40.0 and over, adult: Secondary | ICD-10-CM | POA: Diagnosis not present

## 2022-11-27 DIAGNOSIS — E785 Hyperlipidemia, unspecified: Secondary | ICD-10-CM | POA: Diagnosis not present

## 2022-11-27 DIAGNOSIS — M1990 Unspecified osteoarthritis, unspecified site: Secondary | ICD-10-CM | POA: Diagnosis not present

## 2022-11-27 DIAGNOSIS — R739 Hyperglycemia, unspecified: Secondary | ICD-10-CM | POA: Diagnosis not present

## 2022-11-27 DIAGNOSIS — E559 Vitamin D deficiency, unspecified: Secondary | ICD-10-CM | POA: Diagnosis not present

## 2022-12-17 DIAGNOSIS — Z6841 Body Mass Index (BMI) 40.0 and over, adult: Secondary | ICD-10-CM | POA: Diagnosis not present

## 2023-02-12 DIAGNOSIS — Z6841 Body Mass Index (BMI) 40.0 and over, adult: Secondary | ICD-10-CM | POA: Diagnosis not present

## 2023-02-26 DIAGNOSIS — E785 Hyperlipidemia, unspecified: Secondary | ICD-10-CM | POA: Diagnosis not present

## 2023-02-26 DIAGNOSIS — I10 Essential (primary) hypertension: Secondary | ICD-10-CM | POA: Diagnosis not present

## 2023-02-26 DIAGNOSIS — M25569 Pain in unspecified knee: Secondary | ICD-10-CM | POA: Diagnosis not present

## 2023-02-26 DIAGNOSIS — E559 Vitamin D deficiency, unspecified: Secondary | ICD-10-CM | POA: Diagnosis not present

## 2023-02-26 DIAGNOSIS — Z6841 Body Mass Index (BMI) 40.0 and over, adult: Secondary | ICD-10-CM | POA: Diagnosis not present

## 2023-02-26 DIAGNOSIS — R739 Hyperglycemia, unspecified: Secondary | ICD-10-CM | POA: Diagnosis not present

## 2023-02-26 DIAGNOSIS — Z79899 Other long term (current) drug therapy: Secondary | ICD-10-CM | POA: Diagnosis not present

## 2023-04-19 DIAGNOSIS — Z6841 Body Mass Index (BMI) 40.0 and over, adult: Secondary | ICD-10-CM | POA: Diagnosis not present

## 2023-05-31 DIAGNOSIS — Z6839 Body mass index (BMI) 39.0-39.9, adult: Secondary | ICD-10-CM | POA: Diagnosis not present

## 2023-05-31 DIAGNOSIS — E785 Hyperlipidemia, unspecified: Secondary | ICD-10-CM | POA: Diagnosis not present

## 2023-05-31 DIAGNOSIS — M25569 Pain in unspecified knee: Secondary | ICD-10-CM | POA: Diagnosis not present

## 2023-05-31 DIAGNOSIS — E559 Vitamin D deficiency, unspecified: Secondary | ICD-10-CM | POA: Diagnosis not present

## 2023-05-31 DIAGNOSIS — E291 Testicular hypofunction: Secondary | ICD-10-CM | POA: Diagnosis not present

## 2023-05-31 DIAGNOSIS — Z125 Encounter for screening for malignant neoplasm of prostate: Secondary | ICD-10-CM | POA: Diagnosis not present

## 2023-05-31 DIAGNOSIS — Z79899 Other long term (current) drug therapy: Secondary | ICD-10-CM | POA: Diagnosis not present

## 2023-05-31 DIAGNOSIS — R739 Hyperglycemia, unspecified: Secondary | ICD-10-CM | POA: Diagnosis not present

## 2023-06-18 DIAGNOSIS — Z6841 Body Mass Index (BMI) 40.0 and over, adult: Secondary | ICD-10-CM | POA: Diagnosis not present

## 2023-06-27 DIAGNOSIS — G8929 Other chronic pain: Secondary | ICD-10-CM | POA: Diagnosis not present

## 2023-06-27 DIAGNOSIS — M1712 Unilateral primary osteoarthritis, left knee: Secondary | ICD-10-CM | POA: Diagnosis not present

## 2023-06-27 DIAGNOSIS — M25562 Pain in left knee: Secondary | ICD-10-CM | POA: Diagnosis not present

## 2023-07-18 DIAGNOSIS — G8929 Other chronic pain: Secondary | ICD-10-CM | POA: Diagnosis not present

## 2023-07-18 DIAGNOSIS — M25562 Pain in left knee: Secondary | ICD-10-CM | POA: Diagnosis not present

## 2023-07-18 DIAGNOSIS — M1712 Unilateral primary osteoarthritis, left knee: Secondary | ICD-10-CM | POA: Diagnosis not present

## 2023-07-25 DIAGNOSIS — M1712 Unilateral primary osteoarthritis, left knee: Secondary | ICD-10-CM | POA: Diagnosis not present

## 2023-07-25 DIAGNOSIS — M25562 Pain in left knee: Secondary | ICD-10-CM | POA: Diagnosis not present

## 2023-07-25 DIAGNOSIS — G8929 Other chronic pain: Secondary | ICD-10-CM | POA: Diagnosis not present

## 2023-08-01 DIAGNOSIS — M1712 Unilateral primary osteoarthritis, left knee: Secondary | ICD-10-CM | POA: Diagnosis not present

## 2023-08-01 DIAGNOSIS — G8929 Other chronic pain: Secondary | ICD-10-CM | POA: Diagnosis not present

## 2023-08-01 DIAGNOSIS — M25562 Pain in left knee: Secondary | ICD-10-CM | POA: Diagnosis not present

## 2023-09-27 DIAGNOSIS — E66813 Obesity, class 3: Secondary | ICD-10-CM | POA: Diagnosis not present

## 2023-09-27 DIAGNOSIS — Z6841 Body Mass Index (BMI) 40.0 and over, adult: Secondary | ICD-10-CM | POA: Diagnosis not present

## 2023-09-27 DIAGNOSIS — Z1330 Encounter for screening examination for mental health and behavioral disorders, unspecified: Secondary | ICD-10-CM | POA: Diagnosis not present

## 2023-10-04 DIAGNOSIS — Z6839 Body mass index (BMI) 39.0-39.9, adult: Secondary | ICD-10-CM | POA: Diagnosis not present

## 2023-10-04 DIAGNOSIS — M79604 Pain in right leg: Secondary | ICD-10-CM | POA: Diagnosis not present

## 2023-10-04 DIAGNOSIS — E785 Hyperlipidemia, unspecified: Secondary | ICD-10-CM | POA: Diagnosis not present

## 2023-10-04 DIAGNOSIS — Z2821 Immunization not carried out because of patient refusal: Secondary | ICD-10-CM | POA: Diagnosis not present

## 2023-10-04 DIAGNOSIS — R739 Hyperglycemia, unspecified: Secondary | ICD-10-CM | POA: Diagnosis not present

## 2023-10-04 DIAGNOSIS — Z79899 Other long term (current) drug therapy: Secondary | ICD-10-CM | POA: Diagnosis not present

## 2023-10-04 DIAGNOSIS — E559 Vitamin D deficiency, unspecified: Secondary | ICD-10-CM | POA: Diagnosis not present

## 2023-12-26 DIAGNOSIS — E559 Vitamin D deficiency, unspecified: Secondary | ICD-10-CM | POA: Diagnosis not present

## 2023-12-26 DIAGNOSIS — E785 Hyperlipidemia, unspecified: Secondary | ICD-10-CM | POA: Diagnosis not present

## 2023-12-26 DIAGNOSIS — M1990 Unspecified osteoarthritis, unspecified site: Secondary | ICD-10-CM | POA: Diagnosis not present

## 2023-12-26 DIAGNOSIS — Z79899 Other long term (current) drug therapy: Secondary | ICD-10-CM | POA: Diagnosis not present

## 2023-12-26 DIAGNOSIS — M79604 Pain in right leg: Secondary | ICD-10-CM | POA: Diagnosis not present

## 2023-12-26 DIAGNOSIS — Z6839 Body mass index (BMI) 39.0-39.9, adult: Secondary | ICD-10-CM | POA: Diagnosis not present

## 2023-12-26 DIAGNOSIS — N182 Chronic kidney disease, stage 2 (mild): Secondary | ICD-10-CM | POA: Diagnosis not present

## 2023-12-27 DIAGNOSIS — E66813 Obesity, class 3: Secondary | ICD-10-CM | POA: Diagnosis not present

## 2023-12-27 DIAGNOSIS — Z6841 Body Mass Index (BMI) 40.0 and over, adult: Secondary | ICD-10-CM | POA: Diagnosis not present

## 2024-04-01 DIAGNOSIS — E785 Hyperlipidemia, unspecified: Secondary | ICD-10-CM | POA: Diagnosis not present

## 2024-04-01 DIAGNOSIS — Z6839 Body mass index (BMI) 39.0-39.9, adult: Secondary | ICD-10-CM | POA: Diagnosis not present

## 2024-04-01 DIAGNOSIS — I10 Essential (primary) hypertension: Secondary | ICD-10-CM | POA: Diagnosis not present

## 2024-04-01 DIAGNOSIS — E559 Vitamin D deficiency, unspecified: Secondary | ICD-10-CM | POA: Diagnosis not present

## 2024-04-01 DIAGNOSIS — R739 Hyperglycemia, unspecified: Secondary | ICD-10-CM | POA: Diagnosis not present

## 2024-04-01 DIAGNOSIS — M1990 Unspecified osteoarthritis, unspecified site: Secondary | ICD-10-CM | POA: Diagnosis not present

## 2024-04-01 DIAGNOSIS — N182 Chronic kidney disease, stage 2 (mild): Secondary | ICD-10-CM | POA: Diagnosis not present

## 2024-07-07 DIAGNOSIS — R202 Paresthesia of skin: Secondary | ICD-10-CM | POA: Diagnosis not present

## 2024-07-07 DIAGNOSIS — M1990 Unspecified osteoarthritis, unspecified site: Secondary | ICD-10-CM | POA: Diagnosis not present

## 2024-07-07 DIAGNOSIS — I10 Essential (primary) hypertension: Secondary | ICD-10-CM | POA: Diagnosis not present

## 2024-07-07 DIAGNOSIS — Z79899 Other long term (current) drug therapy: Secondary | ICD-10-CM | POA: Diagnosis not present

## 2024-07-07 DIAGNOSIS — N182 Chronic kidney disease, stage 2 (mild): Secondary | ICD-10-CM | POA: Diagnosis not present

## 2024-07-07 DIAGNOSIS — E785 Hyperlipidemia, unspecified: Secondary | ICD-10-CM | POA: Diagnosis not present

## 2024-07-07 DIAGNOSIS — R739 Hyperglycemia, unspecified: Secondary | ICD-10-CM | POA: Diagnosis not present

## 2024-07-07 DIAGNOSIS — E559 Vitamin D deficiency, unspecified: Secondary | ICD-10-CM | POA: Diagnosis not present

## 2024-07-07 DIAGNOSIS — M79609 Pain in unspecified limb: Secondary | ICD-10-CM | POA: Diagnosis not present

## 2024-07-22 DIAGNOSIS — Z1212 Encounter for screening for malignant neoplasm of rectum: Secondary | ICD-10-CM | POA: Diagnosis not present

## 2024-07-29 LAB — COLOGUARD: COLOGUARD: NEGATIVE
